# Patient Record
Sex: Male | Born: 1935 | Race: White | Hispanic: No | Marital: Married | Smoking: Never smoker
Health system: Southern US, Community
[De-identification: ages and names within clinical notes are randomized; demographics above are authoritative.]

## PROBLEM LIST (undated history)

## (undated) DIAGNOSIS — G4733 Obstructive sleep apnea (adult) (pediatric): Secondary | ICD-10-CM

## (undated) DIAGNOSIS — E785 Hyperlipidemia, unspecified: Secondary | ICD-10-CM

## (undated) DIAGNOSIS — I2510 Atherosclerotic heart disease of native coronary artery without angina pectoris: Secondary | ICD-10-CM

## (undated) DIAGNOSIS — I6529 Occlusion and stenosis of unspecified carotid artery: Secondary | ICD-10-CM

## (undated) DIAGNOSIS — I10 Essential (primary) hypertension: Secondary | ICD-10-CM

## (undated) DIAGNOSIS — I509 Heart failure, unspecified: Secondary | ICD-10-CM

## (undated) DIAGNOSIS — R972 Elevated prostate specific antigen [PSA]: Secondary | ICD-10-CM

## (undated) HISTORY — DX: Elevated prostate specific antigen (PSA): R97.20

## (undated) HISTORY — DX: Atherosclerotic heart disease of native coronary artery without angina pectoris: I25.10

## (undated) HISTORY — DX: Obstructive sleep apnea (adult) (pediatric): G47.33

## (undated) HISTORY — DX: Occlusion and stenosis of unspecified carotid artery: I65.29

## (undated) HISTORY — DX: Essential (primary) hypertension: I10

## (undated) HISTORY — DX: Hyperlipidemia, unspecified: E78.5

---

## 2009-03-20 ENCOUNTER — Emergency Department (HOSPITAL_COMMUNITY): Admission: EM | Admit: 2009-03-20 | Discharge: 2009-03-20 | Payer: Self-pay | Admitting: Emergency Medicine

## 2009-03-21 ENCOUNTER — Encounter (INDEPENDENT_AMBULATORY_CARE_PROVIDER_SITE_OTHER): Payer: Self-pay | Admitting: Internal Medicine

## 2009-03-21 ENCOUNTER — Ambulatory Visit: Payer: Self-pay | Admitting: Internal Medicine

## 2009-03-21 ENCOUNTER — Observation Stay (HOSPITAL_COMMUNITY): Admission: EM | Admit: 2009-03-21 | Discharge: 2009-03-21 | Payer: Self-pay | Admitting: Emergency Medicine

## 2009-03-22 ENCOUNTER — Ambulatory Visit: Payer: Self-pay | Admitting: Cardiovascular Disease

## 2009-03-22 ENCOUNTER — Inpatient Hospital Stay (HOSPITAL_COMMUNITY): Admission: AD | Admit: 2009-03-22 | Discharge: 2009-03-25 | Payer: Self-pay | Admitting: Cardiovascular Disease

## 2009-03-22 ENCOUNTER — Encounter: Payer: Self-pay | Admitting: Internal Medicine

## 2009-03-22 ENCOUNTER — Ambulatory Visit: Payer: Self-pay

## 2009-03-30 ENCOUNTER — Telehealth: Payer: Self-pay | Admitting: Internal Medicine

## 2009-04-05 ENCOUNTER — Ambulatory Visit: Payer: Self-pay | Admitting: Internal Medicine

## 2009-04-05 DIAGNOSIS — I2510 Atherosclerotic heart disease of native coronary artery without angina pectoris: Secondary | ICD-10-CM | POA: Insufficient documentation

## 2009-04-05 DIAGNOSIS — E785 Hyperlipidemia, unspecified: Secondary | ICD-10-CM

## 2009-04-05 DIAGNOSIS — I10 Essential (primary) hypertension: Secondary | ICD-10-CM

## 2009-05-01 ENCOUNTER — Inpatient Hospital Stay (HOSPITAL_COMMUNITY): Admission: EM | Admit: 2009-05-01 | Discharge: 2009-05-02 | Payer: Self-pay | Admitting: Emergency Medicine

## 2009-05-01 ENCOUNTER — Ambulatory Visit: Payer: Self-pay | Admitting: Cardiology

## 2009-05-04 ENCOUNTER — Telehealth: Payer: Self-pay | Admitting: Internal Medicine

## 2009-05-16 ENCOUNTER — Encounter: Payer: Self-pay | Admitting: Internal Medicine

## 2009-05-16 ENCOUNTER — Ambulatory Visit: Payer: Self-pay | Admitting: Cardiovascular Disease

## 2009-05-16 ENCOUNTER — Encounter: Payer: Self-pay | Admitting: Nurse Practitioner

## 2009-05-24 ENCOUNTER — Encounter: Payer: Self-pay | Admitting: Internal Medicine

## 2009-07-18 ENCOUNTER — Ambulatory Visit: Payer: Self-pay | Admitting: Internal Medicine

## 2009-07-18 DIAGNOSIS — R0683 Snoring: Secondary | ICD-10-CM | POA: Insufficient documentation

## 2009-12-27 ENCOUNTER — Ambulatory Visit: Payer: Self-pay | Admitting: Internal Medicine

## 2010-07-24 NOTE — Assessment & Plan Note (Signed)
Summary: f84m   Visit Type:  Follow-up Primary Provider:  Dr. Lerry Paterson  CC:  no complaints.  History of Present Illness: Chris Guzman is a 77 y/o  male with h/o HTN, HL and OSA. Also with h/o coronary artery disease s/p DES to LAD stent and PCI of RCA and diagonal in Sept 2010. In November 2010 had relook cath showing patent stent and small vessel dz, not favorable for pci.  He has been medically managed and recently started on Imdur 30mg  two times a day and referred to cardiac rehab..  Returns for f/u. Feeling very good. Going to maintenance program for CR on Tues and Thurs.  Satving very actice in garde. No further CP.  No HF symptoms. Compliant with Plavix. Bruises easily. Felt much better with cardiac rehab. Snores after a long days work but wife has not witnessed any apnea.    Current Medications (verified): 1)  Plavix 75 Mg Tabs (Clopidogrel Bisulfate) .... Take One Tablet By Mouth Daily 2)  Simvastatin 80 Mg Tabs (Simvastatin) .... Take One Tablet By Mouth Daily At Bedtime 3)  Amlodipine Besylate 10 Mg Tabs (Amlodipine Besylate) .... Take One Tablet By Mouth Daily 4)  Aspirin 81 Mg Tbec (Aspirin) .... Take One Tablet By Mouth Daily 5)  Benicar Hct 20-12.5 Mg Tabs (Olmesartan Medoxomil-Hctz) .... Take One-Half Tablet By Mouth Once Daily. 6)  Centrum Silver  Tabs (Multiple Vitamins-Minerals) .... Take One Tablet By Mouth Once Daily. 7)  Folic Acid 400 Mcg Tabs (Folic Acid) .Marland Kitchen.. 1 Tab Once Daily 8)  Pepcid 20 Mg Tabs (Famotidine) .Marland Kitchen.. 1 Tab Once Daily 9)  Vitamin B-12 250 Mcg Tabs (Cyanocobalamin) .Marland Kitchen.. 1 Tab Once Daily 10)  Vitamin D 1000 Unit Tabs (Cholecalciferol) .Marland Kitchen.. 1 Tab Once Daily 11)  Isosorbide Mononitrate Cr 30 Mg Xr24h-Tab (Isosorbide Mononitrate) .Marland Kitchen.. 1 Tab Am 1 Tab Pm 12)  Fish Oil   Oil (Fish Oil) .... 1200mg  Two Times A Day 13)  Vitamin B-6 50 Mg Tabs (Pyridoxine Hcl) .... Once Daily 14)  Niacin 250 Mg Tabs (Niacin) .... 2 Tabs At Bedtime  Allergies (verified): No Known  Drug Allergies  Past History:  Past Medical History: Last updated: 07/18/2009 CAD   --aborted inferior ST elevation on treadmill 9/10   --PCI of RCA, DES stent to LAD with PTCA of D2 9/10   --Relook cath 04/2009 - patent LAD stent with small branch vessel and dist. RCA dzs with L>R collaterals - not favorable for PCI - Imdur 30mg  two times a day started. Hyperlipidemia Probable OSA HTN Elevated PSA  Review of Systems       As per HPI and past medical history; otherwise all systems negative.   Vital Signs:  Patient profile:   77 year old male Height:      71 inches Weight:      192 pounds BMI:     26.88 Pulse rate:   51 / minute BP sitting:   124 / 70  (right arm) Cuff size:   regular  Vitals Entered By: Hardin Negus, RMA (December 27, 2009 11:26 AM)  Physical Exam  General:  Well appearing. no resp difficulty HEENT: normal Neck: supple. no JVD. Carotids 2+ bilat; no bruits. No lymphadenopathy or thryomegaly appreciated. Cor: PMI nondisplaced. Regular rate & rhythm. No rubs, gallops, murmur. Lungs: clear Abdomen: soft, nontender, nondistended. No hepatosplenomegaly. No bruits or masses. Good bowel sounds. Extremities: no cyanosis, clubbing, rash, edema Neuro: alert & orientedx3, cranial nerves grossly intact. moves all 4 extremities w/o  difficulty. affect pleasant    Impression & Recommendations:  Problem # 1:  CAD, NATIVE VESSEL (ICD-414.01) Stable. No evidence of ischemia. Continue current regimen.  Problem # 2:  HYPERTENSION, BENIGN (ICD-401.1) Blood pressure well controlled. Continue current regimen.  Problem # 3:  HYPERLIPIDEMIA-MIXED (ICD-272.4) On simva 80 and amlodipine 10. There is new FDA black box warning against this will change simva to Crestor 40.  Other Orders: EKG w/ Interpretation (93000)  Patient Instructions: 1)  Stop Simvastatin 2)  Start Crestor 40mg  daily 3)  Your physician wants you to follow-up in: 6 months.  You will receive a  reminder letter in the mail two months in advance. If you don't receive a letter, please call our office to schedule the follow-up appointment. Prescriptions: CRESTOR 40 MG TABS (ROSUVASTATIN CALCIUM) Take one tablet by mouth daily.  #30 x 6   Entered by:   Meredith Staggers, RN   Authorized by:   Dolores Patty, MD, [facility]   Signed by:   Meredith Staggers, RN on 12/27/2009   Method used:   Electronically to        [facility] [address] * (retail)       [address]       [place], [place]  [address]       Ph: [phone]       Fax: [phone]   RxID:   [id number]

## 2010-07-24 NOTE — Letter (Signed)
Summary: [facility]   Imported By: Kassie Mends 08/11/2009 08:04:26  _____________________________________________________________________  External Attachment:    Type:   Image     Comment:   External Document

## 2010-07-24 NOTE — Assessment & Plan Note (Signed)
Summary: 2 MONTH/DMILLER   Visit Type:  Follow-up Primary Provider:  Dr. Lerry Paterson  CC:  no complaints pt states he is doing good with the last med change. Pt saw urologic  clinic because his PSA was high..  History of Present Illness: Chris Guzman is a 77 y/o  male with h/o HTN, HL and OSA. Also with h/o coronary artery disease s/p DES to LAD stent and PCI of RCA and diagonal in Sept 2010. In November 2010 had relook cath showing patent stent and small vessel dz, not favorable for pci.  He has been medically managed and recently started on Imdur 30mg  two times a day and referred to cardiac rehab..  Returns for f/u. Feeling much better. No further CP. Staying active. No HF symptoms. Compliant with Plavix. Felt much better with cardiac rehab. Now no longer exercising regularly. Snoring heavily but wife has not witnessed any apnea.   Recently found to have elevated PSA and recommened to stop Plavix and ASA for 7 days.   Current Medications (verified): 1)  Plavix 75 Mg Tabs (Clopidogrel Bisulfate) .... Take One Tablet By Mouth Daily 2)  Simvastatin 40 Mg Tabs (Simvastatin) .... Take One Tablet By Mouth Daily At Bedtime 3)  Amlodipine Besylate 10 Mg Tabs (Amlodipine Besylate) .... Take One Tablet By Mouth Daily 4)  Aspirin Ec 325 Mg Tbec (Aspirin) .... Take One Tablet By Mouth Daily 5)  Benicar Hct 20-12.5 Mg Tabs (Olmesartan Medoxomil-Hctz) .... Take One-Half Tablet By Mouth Once Daily. 6)  Centrum Silver  Tabs (Multiple Vitamins-Minerals) .... Take One Tablet By Mouth Once Daily. 7)  Folic Acid 400 Mcg Tabs (Folic Acid) .Marland Kitchen.. 1 Tab Once Daily 8)  Pepcid 20 Mg Tabs (Famotidine) .Marland Kitchen.. 1 Tab Once Daily 9)  Vitamin B-12 250 Mcg Tabs (Cyanocobalamin) .Marland Kitchen.. 1 Tab Once Daily 10)  Vitamin D 1000 Unit Tabs (Cholecalciferol) .Marland Kitchen.. 1 Tab Once Daily 11)  Isosorbide Mononitrate Cr 30 Mg Xr24h-Tab (Isosorbide Mononitrate) .Marland Kitchen.. 1 Tab Am 1 Tab Pm  Allergies (verified): No Known Drug Allergies  Past  History:  Past Medical History: CAD   --aborted inferior ST elevation on treadmill 9/10   --PCI of RCA, DES stent to LAD with PTCA of D2 9/10   --Relook cath 04/2009 - patent LAD stent with small branch vessel and dist. RCA dzs with L>R collaterals - not favorable for PCI - Imdur 30mg  two times a day started. Hyperlipidemia Probable OSA HTN Elevated PSA  Review of Systems       As per HPI and past medical history; otherwise all systems negative.   Vital Signs:  Patient profile:   77 year old male Height:      71 inches Weight:      199 pounds BMI:     27.86 Pulse rate:   60 / minute BP sitting:   130 / 80 Cuff size:   large  Vitals Entered By: Burnett Kanaris, CNA (July 18, 2009 3:28 PM)  Physical Exam  General:  Gen: well appearing. no resp difficulty HEENT: normal Neck: supple. no JVD. Carotids 2+ bilat; no bruits. No lymphadenopathy or thryomegaly appreciated. Cor: PMI nondisplaced. Regular rate & rhythm. No rubs, gallops, murmur. Lungs: clear Abdomen: soft, nontender, nondistended. No hepatosplenomegaly. No bruits or masses. Good bowel sounds. Extremities: no cyanosis, clubbing, rash, edema Neuro: alert & orientedx3, cranial nerves grossly intact. moves all 4 extremities w/o difficulty. affect pleasant    Impression & Recommendations:  Problem # 1:  CAD, NATIVE VESSEL (ICD-414.01) Stable. No evidence  of ischemia. Continue current regimen.Have encouraged to participate in the cardiac rehab maintenance program. Given DES to LAD would not stop Plavix for biopsy. I called and spoke to the nure in his urologist's office and discussed this with her. They have experiece doing prostate biopsies on Plavix and will discuss it with him. OK to hold ASA, if needed.  Problem # 2:  HYPERTENSION, BENIGN (ICD-401.1) Blood pressure well controlled. Continue current regimen.  Problem # 3:  HYPERLIPIDEMIA-MIXED (ICD-272.4) Followed by PCP. Goal LDL < 70. Continue  statin.  Problem # 4:  SNORING (ICD-786.09) No witnessed apnea. If persists will need sleep study or overnight oximetry.  Patient Instructions: 1)  Follow up in 6 months

## 2010-09-26 LAB — CARDIAC PANEL(CRET KIN+CKTOT+MB+TROPI)
CK, MB: 2.5 ng/mL (ref 0.3–4.0)
Relative Index: 1.8 (ref 0.0–2.5)
Relative Index: INVALID (ref 0.0–2.5)
Relative Index: INVALID (ref 0.0–2.5)
Total CK: 137 U/L (ref 7–232)
Troponin I: 0.02 ng/mL (ref 0.00–0.06)
Troponin I: 0.03 ng/mL (ref 0.00–0.06)

## 2010-09-26 LAB — POCT CARDIAC MARKERS
Myoglobin, poc: 90 ng/mL (ref 12–200)
Troponin i, poc: <0.05 ng/mL (ref 0.00–0.09)

## 2010-09-26 LAB — CBC
HCT: 43.6 % (ref 39.0–52.0)
Hemoglobin: 13.6 g/dL (ref 13.0–17.0)
Hemoglobin: 15.1 g/dL (ref 13.0–17.0)
MCV: 97.9 fL (ref 78.0–100.0)
MCV: 98 fL (ref 78.0–100.0)
Platelets: 150 10*3/uL (ref 150–400)
RBC: 4.05 MIL/uL — ABNORMAL LOW (ref 4.22–5.81)
RDW: 13.4 % (ref 11.5–15.5)
WBC: 6.3 10*3/uL (ref 4.0–10.5)

## 2010-09-26 LAB — DIFFERENTIAL
Eosinophils Absolute: 0.1 10*3/uL (ref 0.0–0.7)
Eosinophils Relative: 2 % (ref 0–5)
Lymphocytes Relative: 19 % (ref 12–46)
Lymphs Abs: 1.2 10*3/uL (ref 0.7–4.0)
Monocytes Relative: 12 % (ref 3–12)

## 2010-09-26 LAB — APTT: aPTT: 35 seconds (ref 24–37)

## 2010-09-26 LAB — COMPREHENSIVE METABOLIC PANEL
ALT: 18 U/L (ref 0–53)
AST: 19 U/L (ref 0–37)
Albumin: 3.9 g/dL (ref 3.5–5.2)
CO2: 29 mEq/L (ref 19–32)
Calcium: 9.6 mg/dL (ref 8.4–10.5)
Creatinine, Ser: 1.01 mg/dL (ref 0.4–1.5)
GFR calc Af Amer: >60 mL/min (ref >60)
Sodium: 141 mEq/L (ref 135–145)

## 2010-09-26 LAB — BASIC METABOLIC PANEL
CO2: 29 mEq/L (ref 19–32)
Calcium: 9.2 mg/dL (ref 8.4–10.5)
Chloride: 105 mEq/L (ref 96–112)
Creatinine, Ser: 1.1 mg/dL (ref 0.4–1.5)
Creatinine, Ser: 1.24 mg/dL (ref 0.4–1.5)
GFR calc Af Amer: >60 mL/min (ref >60)
GFR calc Af Amer: >60 mL/min (ref >60)
GFR calc non Af Amer: >60 mL/min (ref >60)
Glucose, Bld: 87 mg/dL (ref 70–99)
Sodium: 140 mEq/L (ref 135–145)

## 2010-09-26 LAB — TROPONIN I: Troponin I: 0.02 ng/mL (ref 0.00–0.06)

## 2010-09-26 LAB — POCT I-STAT, CHEM 8
BUN: 13 mg/dL (ref 6–23)
Calcium, Ion: 1.19 mmol/L (ref 1.12–1.32)
Chloride: 101 mEq/L (ref 96–112)
Creatinine, Ser: 1 mg/dL (ref 0.4–1.5)
Sodium: 141 mEq/L (ref 135–145)
TCO2: 28 mmol/L (ref 0–100)

## 2010-09-27 ENCOUNTER — Other Ambulatory Visit: Payer: Self-pay | Admitting: Internal Medicine

## 2010-09-27 LAB — CBC
Hemoglobin: 14.2 g/dL (ref 13.0–17.0)
MCHC: 34.4 g/dL (ref 30.0–36.0)
MCHC: 34.4 g/dL (ref 30.0–36.0)
MCV: 97.2 fL (ref 78.0–100.0)
Platelets: 150 10*3/uL (ref 150–400)
RBC: 4.25 MIL/uL (ref 4.22–5.81)
RDW: 13.4 % (ref 11.5–15.5)
RDW: 13.6 % (ref 11.5–15.5)

## 2010-09-27 LAB — BASIC METABOLIC PANEL
CO2: 28 mEq/L (ref 19–32)
CO2: 29 mEq/L (ref 19–32)
Calcium: 8.8 mg/dL (ref 8.4–10.5)
Chloride: 103 mEq/L (ref 96–112)
Creatinine, Ser: 1.16 mg/dL (ref 0.4–1.5)
Creatinine, Ser: 1.26 mg/dL (ref 0.4–1.5)
GFR calc Af Amer: >60 mL/min (ref >60)
Glucose, Bld: 91 mg/dL (ref 70–99)
Glucose, Bld: 94 mg/dL (ref 70–99)
Sodium: 138 mEq/L (ref 135–145)

## 2010-09-28 LAB — PROTIME-INR
INR: 1 (ref 0.00–1.49)
Prothrombin Time: 13.2 seconds (ref 11.6–15.2)

## 2010-09-28 LAB — DIFFERENTIAL
Lymphocytes Relative: 21 % (ref 12–46)
Lymphs Abs: 1.5 10*3/uL (ref 0.7–4.0)
Monocytes Relative: 9 % (ref 3–12)
Neutro Abs: 5.1 10*3/uL (ref 1.7–7.7)
Neutrophils Relative %: 68 % (ref 43–77)

## 2010-09-28 LAB — CBC
HCT: 41.8 % (ref 39.0–52.0)
Hemoglobin: 13.9 g/dL (ref 13.0–17.0)
Hemoglobin: 14.2 g/dL (ref 13.0–17.0)
MCHC: 34.1 g/dL (ref 30.0–36.0)
MCHC: 34.2 g/dL (ref 30.0–36.0)
MCV: 96.8 fL (ref 78.0–100.0)
Platelets: 162 10*3/uL (ref 150–400)
Platelets: 164 10*3/uL (ref 150–400)
RBC: 4.23 MIL/uL (ref 4.22–5.81)
RDW: 13.2 % (ref 11.5–15.5)
RDW: 13.5 % (ref 11.5–15.5)
WBC: 7.1 10*3/uL (ref 4.0–10.5)

## 2010-09-28 LAB — TROPONIN I: Troponin I: 0.03 ng/mL (ref 0.00–0.06)

## 2010-09-28 LAB — CARDIAC PANEL(CRET KIN+CKTOT+MB+TROPI)
CK, MB: 2 ng/mL (ref 0.3–4.0)
Relative Index: INVALID (ref 0.0–2.5)
Total CK: 69 U/L (ref 7–232)
Troponin I: 0.05 ng/mL (ref 0.00–0.06)
Troponin I: 0.08 ng/mL — ABNORMAL HIGH (ref 0.00–0.06)

## 2010-09-28 LAB — BRAIN NATRIURETIC PEPTIDE: Pro B Natriuretic peptide (BNP): 68 pg/mL (ref 0.0–100.0)

## 2010-09-28 LAB — LIPID PANEL
HDL: 38 mg/dL — ABNORMAL LOW (ref >39)
LDL Cholesterol: 101 mg/dL — ABNORMAL HIGH (ref 0–99)
Triglycerides: 43 mg/dL (ref <150)
VLDL: 9 mg/dL (ref 0–40)

## 2010-09-28 LAB — URINALYSIS, ROUTINE W REFLEX MICROSCOPIC
Bilirubin Urine: NEGATIVE
Nitrite: NEGATIVE
Protein, ur: NEGATIVE mg/dL
Specific Gravity, Urine: 1.015 (ref 1.005–1.030)
Urobilinogen, UA: 1 mg/dL (ref 0.0–1.0)

## 2010-09-28 LAB — COMPREHENSIVE METABOLIC PANEL
Alkaline Phosphatase: 51 U/L (ref 39–117)
BUN: 14 mg/dL (ref 6–23)
BUN: 18 mg/dL (ref 6–23)
CO2: 27 mEq/L (ref 19–32)
CO2: 29 mEq/L (ref 19–32)
Calcium: 8.9 mg/dL (ref 8.4–10.5)
Chloride: 105 mEq/L (ref 96–112)
Creatinine, Ser: 1.1 mg/dL (ref 0.4–1.5)
Creatinine, Ser: 1.12 mg/dL (ref 0.4–1.5)
GFR calc non Af Amer: >60 mL/min (ref >60)
GFR calc non Af Amer: >60 mL/min (ref >60)
Glucose, Bld: 119 mg/dL — ABNORMAL HIGH (ref 70–99)
Glucose, Bld: 97 mg/dL (ref 70–99)
Potassium: 3.9 mEq/L (ref 3.5–5.1)
Sodium: 138 mEq/L (ref 135–145)
Total Bilirubin: 1.1 mg/dL (ref 0.3–1.2)
Total Protein: 6.6 g/dL (ref 6.0–8.3)

## 2010-09-28 LAB — URINE DRUGS OF ABUSE SCREEN W ALC, ROUTINE (REF LAB)
Barbiturate Quant, Ur: NEGATIVE
Benzodiazepines.: NEGATIVE
Cocaine Metabolites: NEGATIVE
Creatinine,U: 83.9 mg/dL
Ethyl Alcohol: <10 mg/dL (ref <10)
Methadone: NEGATIVE
Phencyclidine (PCP): NEGATIVE

## 2010-09-28 LAB — BASIC METABOLIC PANEL
BUN: 14 mg/dL (ref 6–23)
CO2: 31 mEq/L (ref 19–32)
Calcium: 8.9 mg/dL (ref 8.4–10.5)
Creatinine, Ser: 1.14 mg/dL (ref 0.4–1.5)
Glucose, Bld: 92 mg/dL (ref 70–99)
Sodium: 141 mEq/L (ref 135–145)

## 2010-09-28 LAB — CK TOTAL AND CKMB (NOT AT ARMC)
Relative Index: INVALID (ref 0.0–2.5)
Total CK: 72 U/L (ref 7–232)
Total CK: 82 U/L (ref 7–232)

## 2010-09-28 LAB — LIPASE, BLOOD: Lipase: 26 U/L (ref 11–59)

## 2010-09-28 LAB — HEMOGLOBIN A1C: Mean Plasma Glucose: 108 mg/dL

## 2010-12-27 ENCOUNTER — Encounter: Payer: Self-pay | Admitting: Internal Medicine

## 2011-02-06 ENCOUNTER — Encounter: Payer: Self-pay | Admitting: Internal Medicine

## 2011-02-06 ENCOUNTER — Ambulatory Visit (INDEPENDENT_AMBULATORY_CARE_PROVIDER_SITE_OTHER): Payer: Medicare Other | Admitting: Internal Medicine

## 2011-02-06 DIAGNOSIS — I2510 Atherosclerotic heart disease of native coronary artery without angina pectoris: Secondary | ICD-10-CM

## 2011-02-06 DIAGNOSIS — E785 Hyperlipidemia, unspecified: Secondary | ICD-10-CM

## 2011-02-06 DIAGNOSIS — R0609 Other forms of dyspnea: Secondary | ICD-10-CM

## 2011-02-06 DIAGNOSIS — I10 Essential (primary) hypertension: Secondary | ICD-10-CM

## 2011-02-06 MED ORDER — NITROGLYCERIN 0.4 MG SL SUBL: 0.4000 mg | SUBLINGUAL_TABLET | SUBLINGUAL | Status: DC | PRN

## 2011-02-06 NOTE — Patient Instructions (Signed)
Stop Plavix  Your physician recommends that you schedule a follow-up appointment in: 1 year

## 2011-02-06 NOTE — Assessment & Plan Note (Addendum)
Stable with no evidence of ischemia.  Continue current regimen except for Plavix.  Will discontinue plavix with increased bruising.

## 2011-02-06 NOTE — Assessment & Plan Note (Signed)
Mildly hypertensive while in office but BP readings from CR are at target.  Would continue current regimen.

## 2011-02-06 NOTE — Progress Notes (Signed)
HPI:  Chris Guzman is a 77 y/o  male with h/o HTN, HL and OSA. Also with h/o coronary artery disease s/p DES to LAD stent and PCI of RCA and diagonal in Sept 2010. In November 2010 had relook cath showing patent stent and small vessel dz, not favorable for pci.  He has been medically managed and recently started on Imdur 30mg  two times a day and referred to cardiac rehab and continues to go on Tues and Thurs.    He returns for scheduled f/u today.  He is doing well.  He continues to complete cardiac rehab twice a week as well as work in his garden daily.  He denies CP, SOB, orthopnea or PND.  He does not record his BP at home but at rehab he states SBP 100-112.  Continues to be compliant with all his meds.  He continues to bruise easily with plavix use.  His wifes says snoring has continues but sleeping well throughout the evening.     ROS: All systems negative except as listed in HPI, PMH and Problem List.  Past Medical History  Diagnosis Date  . CAD (coronary artery disease)     aborted inf STEMI on treadmill 9/10; PCI of RCA, DES to LAD  with PTCA  of D2 9/10; relook cath 11/10-patent LAD stent with small branch vessel and dis RCA disease  . Hyperlipidemia   . OSA (obstructive sleep apnea)     probable  . HTN (hypertension)   . Elevated PSA     Current Outpatient Prescriptions  Medication Sig Dispense Refill  . amLODipine (NORVASC) 10 MG tablet Take 10 mg by mouth daily.        Marland Kitchen aspirin 81 MG tablet Take 81 mg by mouth daily.        . Cholecalciferol (VITAMIN D) 1000 UNITS capsule Take 1,000 Units by mouth daily.        . famotidine (PEPCID) 20 MG tablet Take 20 mg by mouth daily as needed.       . fish oil-omega-3 fatty acids 1000 MG capsule Take by mouth 2 (two) times daily. 1200mg  two times a day       . folic acid (FOLVITE) 400 MCG tablet Take 400 mcg by mouth daily.        . isosorbide mononitrate (IMDUR) 30 MG CR tablet Take by mouth. 1 tab am 1 tab pm       . Multiple  Vitamins-Minerals (CENTRUM SILVER PO) Take 1 capsule by mouth daily.        Marland Kitchen olmesartan-hydrochlorothiazide (BENICAR HCT) 20-12.5 MG per tablet Take 0.5 tablets by mouth daily.        Marland Kitchen PLAVIX 75 MG tablet TAKE ONE TABLET BY MOUTH EVERY DAY  30 each  6  . pyridOXINE (VITAMIN B-6) 50 MG tablet Take 50 mg by mouth daily.        . rosuvastatin (CRESTOR) 40 MG tablet Take 40 mg by mouth daily.        . vitamin B-12 (CYANOCOBALAMIN) 250 MCG tablet Take 250 mcg by mouth daily.           PHYSICAL EXAM: Filed Vitals:   02/06/11 1131  BP: 148/72  Pulse: 55   General:  Well appearing. No resp difficulty HEENT: normal Neck: supple. JVP flat. Carotids 2+ bilaterally; no bruits. No lymphadenopathy or thryomegaly appreciated. Cor: PMI normal. Regular rate & rhythm. No rubs, gallops or murmurs. Lungs: clear Abdomen: soft, nontender, nondistended. No hepatosplenomegaly. No bruits or  masses. Good bowel sounds. Extremities: no cyanosis, clubbing, rash, edema Neuro: alert & orientedx3, cranial nerves grossly intact. Moves all 4 extremities w/o difficulty. Affect pleasant.    ECG: Sinus brady at 55 bpm.  No acute changes.     ASSESSMENT & PLAN:

## 2011-02-06 NOTE — Assessment & Plan Note (Signed)
Followed by PCP.  Continue current regimen as he is doing well on crestor.

## 2011-02-06 NOTE — Assessment & Plan Note (Addendum)
Snoring continues but with his activity level being vigorous would not pursue sleep study at this time.

## 2011-03-18 ENCOUNTER — Other Ambulatory Visit: Payer: Self-pay | Admitting: Internal Medicine

## 2011-07-29 ENCOUNTER — Other Ambulatory Visit (HOSPITAL_COMMUNITY): Payer: Self-pay | Admitting: Internal Medicine

## 2012-01-21 ENCOUNTER — Telehealth (HOSPITAL_COMMUNITY): Payer: Self-pay | Admitting: Internal Medicine

## 2012-01-21 NOTE — Telephone Encounter (Signed)
Left message for pt to call back to discuss why he feels he would need antibiotics prior to tooth extraction.  Nothing noted in chart to suggest a need for this.

## 2012-01-21 NOTE — Telephone Encounter (Signed)
Pt wife wants to know if pt will need meds due to he is having teeth extracted and needs to asap. She wants to know if he needs antibiotics. If so please call and leave message. Thanks

## 2012-01-21 NOTE — Telephone Encounter (Signed)
Will forward to [place] and Dr Gala Romney to be reviewed.

## 2012-02-13 ENCOUNTER — Encounter (HOSPITAL_COMMUNITY): Payer: Self-pay

## 2012-02-13 ENCOUNTER — Ambulatory Visit (HOSPITAL_COMMUNITY)
Admission: RE | Admit: 2012-02-13 | Discharge: 2012-02-13 | Disposition: A | Discharge status: Home / Self Care | Payer: Medicare Other | Source: Ambulatory Visit | Attending: Internal Medicine | Admitting: Internal Medicine

## 2012-02-13 VITALS — BP 120/64 | HR 62 | Ht 71.0 in | Wt 194.0 lb

## 2012-02-13 DIAGNOSIS — E785 Hyperlipidemia, unspecified: Secondary | ICD-10-CM

## 2012-02-13 DIAGNOSIS — R0609 Other forms of dyspnea: Secondary | ICD-10-CM | POA: Insufficient documentation

## 2012-02-13 DIAGNOSIS — R0989 Other specified symptoms and signs involving the circulatory and respiratory systems: Secondary | ICD-10-CM | POA: Insufficient documentation

## 2012-02-13 DIAGNOSIS — I2510 Atherosclerotic heart disease of native coronary artery without angina pectoris: Secondary | ICD-10-CM

## 2012-02-13 DIAGNOSIS — I10 Essential (primary) hypertension: Secondary | ICD-10-CM | POA: Insufficient documentation

## 2012-02-13 DIAGNOSIS — E782 Mixed hyperlipidemia: Secondary | ICD-10-CM | POA: Insufficient documentation

## 2012-02-13 NOTE — Assessment & Plan Note (Addendum)
Blood pressure well controlled. Continue current regimen.  

## 2012-02-13 NOTE — Assessment & Plan Note (Addendum)
Noted R>L. I suspect this may be due to radiation of AoV. Check carotoid dopplers. S2 very crisp. 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

## 2012-02-13 NOTE — Assessment & Plan Note (Addendum)
Followed by PCP. Discussed results of AIM-HIGH trial (no reduction in CV events with niacin when added to patients already with their LDL controlled on statin). If unable to tolerate can stop and increase fish oil to 3 grams twice a day to help control TGs, Also increase activity and watch how much ice cream he is eating.

## 2012-02-13 NOTE — Assessment & Plan Note (Addendum)
No evidence of ischemia. Continue current regimen. Follow up in 1 year.  

## 2012-02-13 NOTE — Assessment & Plan Note (Addendum)
Symptoms very concerning for OSA. Will refer to pulmonary for sleep study.

## 2012-02-13 NOTE — Patient Instructions (Addendum)
You have been referred to Pulmonary  Your physician has requested that you have a carotid duplex. This test is an ultrasound of the carotid arteries in your neck. It looks at blood flow through these arteries that supply the brain with blood. Allow one hour for this exam. There are no restrictions or special instructions.  We will contact you in 1 year to schedule your next appointment.

## 2012-02-16 NOTE — Progress Notes (Signed)
PCP: Dr. Jonelle Sports  Patient ID: Chris Guzman, male   DOB: 1933/11/12, 78 y.o.   MRN: [id number] HPI:  Chris Guzman is a 78 y/o  male with h/o HTN, HL and OSA. Also with h/o coronary artery disease s/p DES to LAD stent and PCI of RCA and diagonal in Sept 2010. In November 2010 had relook cath showing patent stent and small vessel dz, not favorable for pci.  He has been medically managed and referred to cardiac rehab and continues to go on Tues and Thurs.    He returns for follow up. Continues to actively garden and goes to cardiac rehab 2 times per week..Denies SOB/PND/Orthopnea/CP. Cholesterol followed by family doctor. Complains of fatigue during the day. Snores a lot. Compliant with medications.  Dr. Lelon Perla recently restarted Niacin due to high TGs. Tolerating OK.     ROS: All systems negative except as listed in HPI, PMH and Problem List.  Past Medical History  Diagnosis Date  . CAD (coronary artery disease)     aborted inf STEMI on treadmill 9/10; PCI of RCA, DES to LAD  with PTCA  of D2 9/10; relook cath 11/10-patent LAD stent with small branch vessel and dis RCA disease  . Hyperlipidemia   . OSA (obstructive sleep apnea)     probable  . HTN (hypertension)   . Elevated PSA     Current Outpatient Prescriptions  Medication Sig Dispense Refill  . amLODipine (NORVASC) 10 MG tablet Take 10 mg by mouth daily.        Marland Kitchen aspirin 81 MG tablet Take 81 mg by mouth daily.        . Cholecalciferol (VITAMIN D) 1000 UNITS capsule Take 1,000 Units by mouth daily.        . famotidine (PEPCID) 20 MG tablet Take 20 mg by mouth daily as needed.       . fish oil-omega-3 fatty acids 1000 MG capsule Take by mouth 2 (two) times daily. 1200mg  two times a day       . folic acid (FOLVITE) 400 MCG tablet Take 400 mcg by mouth daily.        . isosorbide mononitrate (IMDUR) 30 MG 24 hr tablet       . Multiple Vitamins-Minerals (CENTRUM SILVER PO) Take 1 capsule by mouth daily.        . niacin 250 MG tablet  Take 250 mg by mouth daily with breakfast.      . nitroGLYCERIN (NITROSTAT) 0.4 MG SL tablet Place 1 tablet (0.4 mg total) under the tongue every 5 (five) minutes as needed for chest pain.  25 tablet  3  . olmesartan-hydrochlorothiazide (BENICAR HCT) 20-12.5 MG per tablet Take 0.5 tablets by mouth daily.       Marland Kitchen pyridOXINE (VITAMIN B-6) 50 MG tablet Take 50 mg by mouth daily.        . rosuvastatin (CRESTOR) 40 MG tablet       . vitamin B-12 (CYANOCOBALAMIN) 250 MCG tablet Take 250 mcg by mouth daily.          Physical: Filed Vitals:   02/13/12 1101  BP: 120/64  Pulse: 62  Height: 5\' 11"  (1.803 m)  Weight: 194 lb (87.998 kg)    General:  Well appearing. No resp difficulty wife present HEENT: normal Neck: supple. JVP flat. Carotids 2+ bilaterally; Carotid bruits R>L. Marland Kitchen No lymphadenopathy or thryomegaly appreciated. Cor: PMI normal. Regular rate & rhythm. No rubs, gallops 2/6 SEM over RSB Lungs: clear Abdomen: soft, nontender, nondistended.  No hepatosplenomegaly. No bruits or masses. Good bowel sounds. Extremities: no cyanosis, clubbing, rash, edema Neuro: alert & orientedx3, cranial nerves grossly intact. Moves all 4 extremities w/o difficulty. Affect pleasant.    ECG: .SR 58  No ST-T wave abnormalities.    ASSESSMENT & PLAN:

## 2012-03-06 ENCOUNTER — Institutional Professional Consult (permissible substitution): Payer: PRIVATE HEALTH INSURANCE | Admitting: Pulmonary Disease

## 2012-03-23 ENCOUNTER — Other Ambulatory Visit: Payer: Self-pay | Admitting: Internal Medicine

## 2012-04-08 ENCOUNTER — Institutional Professional Consult (permissible substitution): Payer: PRIVATE HEALTH INSURANCE | Admitting: Pulmonary Disease

## 2012-04-21 ENCOUNTER — Other Ambulatory Visit (HOSPITAL_COMMUNITY): Payer: Self-pay | Admitting: Internal Medicine

## 2012-11-11 ENCOUNTER — Other Ambulatory Visit (HOSPITAL_COMMUNITY): Payer: Self-pay | Admitting: Internal Medicine

## 2013-03-29 ENCOUNTER — Ambulatory Visit (HOSPITAL_COMMUNITY)
Admission: RE | Admit: 2013-03-29 | Discharge: 2013-03-29 | Disposition: A | Discharge status: Home / Self Care | Payer: Medicare Other | Source: Ambulatory Visit | Attending: Cardiology | Admitting: Cardiology

## 2013-03-29 VITALS — BP 144/70 | HR 71 | Wt 194.2 lb

## 2013-03-29 DIAGNOSIS — I6523 Occlusion and stenosis of bilateral carotid arteries: Secondary | ICD-10-CM

## 2013-03-29 DIAGNOSIS — I6529 Occlusion and stenosis of unspecified carotid artery: Secondary | ICD-10-CM

## 2013-03-29 DIAGNOSIS — E785 Hyperlipidemia, unspecified: Secondary | ICD-10-CM | POA: Insufficient documentation

## 2013-03-29 DIAGNOSIS — I2510 Atherosclerotic heart disease of native coronary artery without angina pectoris: Secondary | ICD-10-CM | POA: Insufficient documentation

## 2013-03-29 DIAGNOSIS — I359 Nonrheumatic aortic valve disorder, unspecified: Secondary | ICD-10-CM

## 2013-03-29 DIAGNOSIS — G4733 Obstructive sleep apnea (adult) (pediatric): Secondary | ICD-10-CM | POA: Insufficient documentation

## 2013-03-29 DIAGNOSIS — I498 Other specified cardiac arrhythmias: Secondary | ICD-10-CM | POA: Insufficient documentation

## 2013-03-29 DIAGNOSIS — Z79899 Other long term (current) drug therapy: Secondary | ICD-10-CM | POA: Insufficient documentation

## 2013-03-29 DIAGNOSIS — R0989 Other specified symptoms and signs involving the circulatory and respiratory systems: Secondary | ICD-10-CM | POA: Insufficient documentation

## 2013-03-29 DIAGNOSIS — Z7982 Long term (current) use of aspirin: Secondary | ICD-10-CM | POA: Insufficient documentation

## 2013-03-29 DIAGNOSIS — I10 Essential (primary) hypertension: Secondary | ICD-10-CM | POA: Insufficient documentation

## 2013-03-29 DIAGNOSIS — I658 Occlusion and stenosis of other precerebral arteries: Secondary | ICD-10-CM

## 2013-03-29 NOTE — Patient Instructions (Addendum)
Doing great!!!  Call any issues.  Follow up 1 year with ECHO.  Do the following things EVERYDAY: 1) Weigh yourself in the morning before breakfast. Write it down and keep it in a log. 2) Take your medicines as prescribed 3) Eat low salt foods-Limit salt (sodium) to 2000 mg per day.  4) Stay as active as you can everyday 5) Limit all fluids for the day to less than 2 liters

## 2013-03-30 DIAGNOSIS — I6529 Occlusion and stenosis of unspecified carotid artery: Secondary | ICD-10-CM | POA: Insufficient documentation

## 2013-03-30 DIAGNOSIS — I359 Nonrheumatic aortic valve disorder, unspecified: Secondary | ICD-10-CM | POA: Insufficient documentation

## 2013-03-30 NOTE — Progress Notes (Signed)
Patient ID: Rakeem Colley, male   DOB: October 12, 1933, 79 y.o.   MRN: [id number] PCP: Dr. Jonelle Sports  HPI: Latron is a 79 y/o  male with h/o HTN, HL and OSA. Also with h/o coronary artery disease s/p DES to LAD stent and PCI of RCA and diagonal in Sept 2010. In November 2010 had relook cath showing patent stent and small vessel dz, not favorable for pci.    Follow up: Last visit was referred for sleep study, however he cancelled apt. Doing pretty well overall. Denies SOB, CP, orthopnea, or edema. Having issues with R knee and saw MD in [place], had injection. Still gardening and is very active. Taking medications as prescribed. Dr. Jonelle Sports checking lipids.  ROS: All systems negative except as listed in HPI, PMH and Problem List.  Past Medical History  Diagnosis Date  . CAD (coronary artery disease)     aborted inf STEMI on treadmill 9/10; PCI of RCA, DES to LAD  with PTCA  of D2 9/10; relook cath 11/10-patent LAD stent with small branch vessel and dis RCA disease  . Hyperlipidemia   . OSA (obstructive sleep apnea)     probable  . HTN (hypertension)   . Elevated PSA     Current Outpatient Prescriptions  Medication Sig Dispense Refill  . amLODipine (NORVASC) 10 MG tablet Take 10 mg by mouth daily.        Marland Kitchen aspirin 81 MG tablet Take 81 mg by mouth daily.        . Cholecalciferol (VITAMIN D) 1000 UNITS capsule Take 1,000 Units by mouth daily.        . CRESTOR 40 MG tablet TAKE ONE TABLET BY MOUTH DAILY  30 tablet  6  . famotidine (PEPCID) 20 MG tablet Take 20 mg by mouth daily as needed.       . fish oil-omega-3 fatty acids 1000 MG capsule Take 1 g by mouth 2 (two) times daily.       . folic acid (FOLVITE) 400 MCG tablet Take 400 mcg by mouth daily.        . isosorbide mononitrate (IMDUR) 30 MG 24 hr tablet TAKE ONE TABLET BY MOUTH IN THE MORNING AND ONE TABLET IN THE EVENING  180 tablet  3  . Multiple Vitamins-Minerals (CENTRUM SILVER PO) Take 1 capsule by mouth daily.        . niacin 250 MG  tablet Take 250 mg by mouth daily with breakfast.      . nitroGLYCERIN (NITROSTAT) 0.4 MG SL tablet Place 1 tablet (0.4 mg total) under the tongue every 5 (five) minutes as needed for chest pain.  25 tablet  3  . olmesartan-hydrochlorothiazide (BENICAR HCT) 20-12.5 MG per tablet Take 0.5 tablets by mouth daily.       Marland Kitchen pyridOXINE (VITAMIN B-6) 50 MG tablet Take 25 mg by mouth daily.       . vitamin B-12 (CYANOCOBALAMIN) 250 MCG tablet Take 250 mcg by mouth daily.         No current facility-administered medications for this encounter.   Filed Vitals:   03/29/13 1309  BP: 144/70  Pulse: 71  Weight: 194 lb 4 oz (88.111 kg)  SpO2: 98%   Physical Exam: General:  Well appearing. No resp difficulty wife present HEENT: normal Neck: supple. JVP flat. Carotids 2+ bilaterally; Carotid bruits R>L. Marland Kitchen No lymphadenopathy or thryomegaly appreciated. Cor: PMI normal. Regular rate & rhythm. No rubs, gallops 2/6 SEM over RSB Lungs: clear Abdomen: soft, nontender, nondistended.  No hepatosplenomegaly. No bruits or masses. Good bowel sounds. Extremities: no cyanosis, clubbing, rash, edema Neuro: alert & orientedx3, cranial nerves grossly intact. Moves all 4 extremities w/o difficulty. Affect pleasant.  EKG: Sinus Brady 57 bpm. No ST-T wave abnormalities.    ASSESSMENT & PLAN:  1) CAD  - Remains active. no s/s of ischemia - continue statin and ASA. Can cut back Imudr as needed.  2) HTN - Stable. Continue olmesartan-HCTZ  3) HLD - managed by PCP. Goal LDL < 70. Continue statin. Titrate as needed.   4) OSA - Not interested in being set up for a sleep study.   5) Carotid bruit - Asymptomatic. Followed regularly with u/s by his PCP. Continue statin.  6) AS murmur - mild. Last echo 2010. Will get f/u echo with visit next year  Reuel Boom Reisa Coppola,MD 11:07 PM

## 2013-03-31 NOTE — Addendum Note (Signed)
Encounter addended by: Simon Rhein, CCT on: 03/31/2013  9:04 AM<BR>     Documentation filed: Charges VN

## 2013-05-06 ENCOUNTER — Other Ambulatory Visit: Payer: Self-pay | Admitting: *Deleted

## 2013-05-06 MED ORDER — ROSUVASTATIN CALCIUM 40 MG PO TABS: ORAL_TABLET | ORAL | Status: DC

## 2013-11-19 ENCOUNTER — Other Ambulatory Visit (HOSPITAL_COMMUNITY): Payer: Self-pay | Admitting: Internal Medicine

## 2014-02-18 ENCOUNTER — Other Ambulatory Visit: Payer: Self-pay

## 2014-02-18 MED ORDER — ROSUVASTATIN CALCIUM 40 MG PO TABS: ORAL_TABLET | ORAL | Status: DC

## 2014-03-31 ENCOUNTER — Ambulatory Visit (HOSPITAL_COMMUNITY): Payer: PRIVATE HEALTH INSURANCE

## 2014-03-31 ENCOUNTER — Encounter (HOSPITAL_COMMUNITY): Payer: PRIVATE HEALTH INSURANCE

## 2014-04-08 ENCOUNTER — Other Ambulatory Visit (HOSPITAL_COMMUNITY): Payer: Self-pay | Admitting: Cardiology

## 2014-04-08 DIAGNOSIS — I25119 Atherosclerotic heart disease of native coronary artery with unspecified angina pectoris: Secondary | ICD-10-CM

## 2014-04-11 ENCOUNTER — Encounter (HOSPITAL_COMMUNITY): Payer: Self-pay

## 2014-04-11 ENCOUNTER — Ambulatory Visit (HOSPITAL_COMMUNITY)
Admission: RE | Admit: 2014-04-11 | Discharge: 2014-04-11 | Disposition: A | Discharge status: Home / Self Care | Payer: Medicare Other | Source: Ambulatory Visit | Attending: Internal Medicine | Admitting: Internal Medicine

## 2014-04-11 ENCOUNTER — Ambulatory Visit (HOSPITAL_BASED_OUTPATIENT_CLINIC_OR_DEPARTMENT_OTHER)
Admission: RE | Admit: 2014-04-11 | Discharge: 2014-04-11 | Disposition: A | Discharge status: Home / Self Care | Payer: Medicare Other | Source: Ambulatory Visit | Attending: Cardiology | Admitting: Cardiology

## 2014-04-11 VITALS — BP 140/66 | HR 63 | Wt 189.8 lb

## 2014-04-11 DIAGNOSIS — G4733 Obstructive sleep apnea (adult) (pediatric): Secondary | ICD-10-CM

## 2014-04-11 DIAGNOSIS — I2510 Atherosclerotic heart disease of native coronary artery without angina pectoris: Secondary | ICD-10-CM | POA: Insufficient documentation

## 2014-04-11 DIAGNOSIS — I25119 Atherosclerotic heart disease of native coronary artery with unspecified angina pectoris: Secondary | ICD-10-CM

## 2014-04-11 DIAGNOSIS — I6521 Occlusion and stenosis of right carotid artery: Secondary | ICD-10-CM

## 2014-04-11 DIAGNOSIS — I10 Essential (primary) hypertension: Secondary | ICD-10-CM | POA: Diagnosis not present

## 2014-04-11 DIAGNOSIS — I350 Nonrheumatic aortic (valve) stenosis: Secondary | ICD-10-CM | POA: Diagnosis not present

## 2014-04-11 DIAGNOSIS — E785 Hyperlipidemia, unspecified: Secondary | ICD-10-CM | POA: Diagnosis not present

## 2014-04-11 DIAGNOSIS — I517 Cardiomegaly: Secondary | ICD-10-CM

## 2014-04-11 DIAGNOSIS — I359 Nonrheumatic aortic valve disorder, unspecified: Secondary | ICD-10-CM

## 2014-04-11 NOTE — Progress Notes (Signed)
Patient ID: Chris DolphinRichard Guzman, male   DOB: 07/31/1933, 80 y.o.   MRN: [id number] PCP: Dr. Jonelle SportsPomposini  HPI: Chris BurdockRichard is a 80 y/o  male with h/o HTN, HL and OSA. Also with h/o coronary artery disease s/p DES to LAD and PCI RCA/PTCA diagonal in Sept 2010. In November 2010 had relook cath showing patent stent and small vessel dz, not favorable for PCI.  Carotid dopplers (3/13): Minimal disease.  Echo (10/15): EF 60-65%, mild dilation of aortic root and ascending aorta (4.2/4.0 cm), normal RV size and systolic function, aortic sclerosis without stenosis.   He has been doing well since last appointment.  No chest or epigastric pain.  No exertional dyspnea.  He keeps moderately active.  He snores and falls asleep in the afternoon if he is watching TV.  He does some hunting still.   ROS: All systems negative except as listed in HPI, PMH and Problem List.  Past Medical History  Diagnosis Date  . CAD (coronary artery disease)     aborted inf STEMI on treadmill 9/10; PCI of RCA, DES to LAD  with PTCA  of D2 9/10; relook cath 11/10-patent LAD stent with small branch vessel and dis RCA disease  . Hyperlipidemia   . OSA (obstructive sleep apnea)     probable  . HTN (hypertension)   . Elevated PSA     Current Outpatient Prescriptions  Medication Sig Dispense Refill  . amLODipine (NORVASC) 10 MG tablet Take 10 mg by mouth daily.        Marland Kitchen. aspirin 81 MG tablet Take 81 mg by mouth daily.        . Cholecalciferol (VITAMIN D) 1000 UNITS capsule Take 1,000 Units by mouth daily.        . famotidine (PEPCID) 20 MG tablet Take 20 mg by mouth daily as needed.       . fish oil-omega-3 fatty acids 1000 MG capsule Take 1 g by mouth 2 (two) times daily.       . folic acid (FOLVITE) 400 MCG tablet Take 800 mcg by mouth daily.       . isosorbide mononitrate (IMDUR) 30 MG 24 hr tablet TAKE ONE TABLET BY MOUTH IN THE MORNING AND ONE TABLET IN THE EVENING  180 tablet  3  . Multiple Vitamins-Minerals (CENTRUM SILVER PO) Take  1 capsule by mouth daily.        . niacin 250 MG tablet Take 250 mg by mouth daily with breakfast.      . nitroGLYCERIN (NITROSTAT) 0.4 MG SL tablet Place 1 tablet (0.4 mg total) under the tongue every 5 (five) minutes as needed for chest pain.  25 tablet  3  . olmesartan-hydrochlorothiazide (BENICAR HCT) 20-12.5 MG per tablet Take 0.5 tablets by mouth daily.       Marland Kitchen. pyridOXINE (VITAMIN B-6) 50 MG tablet Take 25 mg by mouth daily.       . rosuvastatin (CRESTOR) 40 MG tablet TAKE ONE TABLET BY MOUTH DAILY  30 tablet  3  . vitamin B-12 (CYANOCOBALAMIN) 250 MCG tablet Take 250 mcg by mouth daily.         No current facility-administered medications for this encounter.   Filed Vitals:   04/11/14 0954  BP: 140/66  Pulse: 63  Weight: 189 lb 12.8 oz (86.093 kg)  SpO2: 98%   Physical Exam: General:  Well appearing. No resp difficulty wife present HEENT: normal Neck: supple. JVP flat. Carotids 2+ bilaterally; Soft right carotid bruit. No lymphadenopathy or  thryomegaly appreciated. Cor: PMI normal. Regular rate & rhythm. No rubs, gallops 1/6 early SEM over RSB Lungs: clear Abdomen: soft, nontender, nondistended. No hepatosplenomegaly. No bruits or masses. Good bowel sounds. Extremities: no cyanosis, clubbing, rash, edema Neuro: alert & orientedx3, cranial nerves grossly intact. Moves all 4 extremities w/o difficulty. Affect pleasant.  ASSESSMENT & PLAN: 1) CAD: No ischemic symptoms.  Doing well overall.  Continue ASA 81, statin, Imdur, ARB.  EF is normal on today's echo with no regional wall motion abnormalities.  2) HTN: BP controlled on current regimen. 3) Hyperlipidemia: Goal LDL < 70.  Recently had lipids at PCP's office, will call for copy.  4) OSA: Suspect OSA.  [place] to [place] in [place].  He is amenable to this. 5) Carotid bruit: PCP follows with carotid ultrasounds.  Last we have is from 2013 with minimal disease.  6) Aortic area murmur: Aortic sclerosis without  significant stenosis on today's echo.   Marca AnconaDalton Micheal Murad 04/11/2014

## 2014-04-11 NOTE — Progress Notes (Signed)
Echocardiogram 2D Echocardiogram has been performed.  Dorothey BasemanReel, Haroldine Redler M 04/11/2014, 8:43 AM

## 2014-04-11 NOTE — Patient Instructions (Signed)
We will contact Dr Jonelle SportsPomposini for your lab work and to inquire about a sleep study  We will contact you in 1 year to schedule your next appointment.

## 2014-11-16 ENCOUNTER — Other Ambulatory Visit (HOSPITAL_COMMUNITY): Payer: Self-pay | Admitting: Internal Medicine

## 2014-11-18 ENCOUNTER — Other Ambulatory Visit (HOSPITAL_COMMUNITY): Payer: Self-pay | Admitting: *Deleted

## 2014-11-18 MED ORDER — ROSUVASTATIN CALCIUM 40 MG PO TABS: ORAL_TABLET | ORAL | Status: DC

## 2015-02-15 ENCOUNTER — Other Ambulatory Visit (HOSPITAL_COMMUNITY): Payer: Self-pay | Admitting: *Deleted

## 2015-02-15 MED ORDER — ISOSORBIDE MONONITRATE ER 30 MG PO TB24: ORAL_TABLET | ORAL | Status: DC

## 2015-02-15 NOTE — Telephone Encounter (Signed)
Pt's wife called for refill, rx sent in

## 2015-02-16 ENCOUNTER — Other Ambulatory Visit (HOSPITAL_COMMUNITY): Payer: Self-pay | Admitting: *Deleted

## 2015-02-16 MED ORDER — ROSUVASTATIN CALCIUM 40 MG PO TABS: ORAL_TABLET | ORAL | Status: DC

## 2015-04-14 ENCOUNTER — Ambulatory Visit (HOSPITAL_COMMUNITY)
Admission: RE | Admit: 2015-04-14 | Discharge: 2015-04-14 | Disposition: A | Discharge status: Home / Self Care | Payer: Medicare Other | Source: Ambulatory Visit | Attending: Internal Medicine | Admitting: Internal Medicine

## 2015-04-14 ENCOUNTER — Encounter (HOSPITAL_COMMUNITY): Payer: Self-pay | Admitting: Internal Medicine

## 2015-04-14 VITALS — BP 126/74 | HR 64 | Wt 191.4 lb

## 2015-04-14 DIAGNOSIS — I359 Nonrheumatic aortic valve disorder, unspecified: Secondary | ICD-10-CM

## 2015-04-14 DIAGNOSIS — E785 Hyperlipidemia, unspecified: Secondary | ICD-10-CM

## 2015-04-14 DIAGNOSIS — I358 Other nonrheumatic aortic valve disorders: Secondary | ICD-10-CM | POA: Diagnosis not present

## 2015-04-14 DIAGNOSIS — I2510 Atherosclerotic heart disease of native coronary artery without angina pectoris: Secondary | ICD-10-CM | POA: Diagnosis not present

## 2015-04-14 DIAGNOSIS — I10 Essential (primary) hypertension: Secondary | ICD-10-CM | POA: Diagnosis not present

## 2015-04-14 DIAGNOSIS — Z7982 Long term (current) use of aspirin: Secondary | ICD-10-CM | POA: Insufficient documentation

## 2015-04-14 DIAGNOSIS — R0683 Snoring: Secondary | ICD-10-CM | POA: Diagnosis not present

## 2015-04-14 DIAGNOSIS — I712 Thoracic aortic aneurysm, without rupture: Secondary | ICD-10-CM | POA: Insufficient documentation

## 2015-04-14 DIAGNOSIS — Z955 Presence of coronary angioplasty implant and graft: Secondary | ICD-10-CM | POA: Insufficient documentation

## 2015-04-14 DIAGNOSIS — Z79899 Other long term (current) drug therapy: Secondary | ICD-10-CM | POA: Diagnosis not present

## 2015-04-14 DIAGNOSIS — R0989 Other specified symptoms and signs involving the circulatory and respiratory systems: Secondary | ICD-10-CM | POA: Insufficient documentation

## 2015-04-14 NOTE — Progress Notes (Signed)
CARDIOLOGY CLINIC NOTE  Patient ID: Chris DolphinRichard Guzman, male   DOB: 1934-01-02, 81 y.o.   MRN: [id number] PCP: Dr. Jonelle SportsPomposini Cardiology: Bensimhon  HPI: Chris BurdockRichard is a 81 y/o  male with h/o HTN, HL and OSA. Also with h/o coronary artery disease s/p DES to LAD and PCI RCA/PTCA diagonal in Sept 2010. In November 2010 had relook cath showing patent stent and small vessel dz, not favorable for PCI.  Carotid dopplers (3/13): Minimal disease.  Echo (10/15): EF 60-65%, mild dilation of aortic root and ascending aorta (4.2/4.0 cm), normal RV size and systolic function, aortic sclerosis without stenosis.   He has been doing well since last appointment.  Working hard in the yard. No chest or epigastric pain.  No exertional dyspnea. Wife says he snores a lot and she has to shake him. Falls asleep quickly. No sleepiness when driving.    ROS: All systems negative except as listed in HPI, PMH and Problem List.  Past Medical History  Diagnosis Date  . CAD (coronary artery disease)     aborted inf STEMI on treadmill 9/10; PCI of RCA, DES to LAD  with PTCA  of D2 9/10; relook cath 11/10-patent LAD stent with small branch vessel and dis RCA disease  . Hyperlipidemia   . OSA (obstructive sleep apnea)     probable  . HTN (hypertension)   . Elevated PSA     Current Outpatient Prescriptions  Medication Sig Dispense Refill  . amLODipine (NORVASC) 10 MG tablet Take 10 mg by mouth daily.      Chris Guzman. aspirin 81 MG tablet Take 81 mg by mouth daily.      . Cholecalciferol (VITAMIN D) 1000 UNITS capsule Take 1,000 Units by mouth daily.      . famotidine (PEPCID) 20 MG tablet Take 20 mg by mouth daily as needed.     . fish oil-omega-3 fatty acids 1000 MG capsule Take 1 g by mouth 2 (two) times daily.     . folic acid (FOLVITE) 400 MCG tablet Take 800 mcg by mouth daily.     . isosorbide mononitrate (IMDUR) 30 MG 24 hr tablet TAKE ONE TABLET BY MOUTH IN THE MORNING AND ONE TABLET IN THE EVENING 180 tablet 2  . Multiple  Vitamins-Minerals (CENTRUM SILVER PO) Take 1 capsule by mouth daily.      . niacin 250 MG tablet Take 250 mg by mouth daily with breakfast.    . olmesartan-hydrochlorothiazide (BENICAR HCT) 20-12.5 MG per tablet Take 0.5 tablets by mouth daily.     Chris Guzman. pyridOXINE (VITAMIN B-6) 50 MG tablet Take 25 mg by mouth daily.     . rosuvastatin (CRESTOR) 40 MG tablet TAKE ONE TABLET BY MOUTH DAILY 30 tablet 3  . vitamin B-12 (CYANOCOBALAMIN) 250 MCG tablet Take 250 mcg by mouth daily.      . nitroGLYCERIN (NITROSTAT) 0.4 MG SL tablet Place 1 tablet (0.4 mg total) under the tongue every 5 (five) minutes as needed for chest pain. 25 tablet 3   No current facility-administered medications for this encounter.   Filed Vitals:   04/14/15 1045  BP: 126/74  Pulse: 64  Weight: 191 lb 6.4 oz (86.818 kg)  SpO2: 98%   Physical Exam: General:  Well appearing. No resp difficulty wife present HEENT: normal Neck: supple. JVP flat. Carotids 2+ bilaterally; Soft right carotid bruit. No lymphadenopathy or thryomegaly appreciated. Cor: PMI normal. Regular rate & rhythm. No rubs, gallops 1/6 early SEM over RSB Lungs: clear Abdomen: soft, nontender,  nondistended. No hepatosplenomegaly. No bruits or masses. Good bowel sounds. Extremities: no cyanosis, clubbing, rash, edema Neuro: alert & orientedx3, cranial nerves grossly intact. Moves all 4 extremities w/o difficulty. Affect pleasant.  ASSESSMENT & PLAN: 1) CAD: No ischemic symptoms.  Doing well overall.  Continue ASA 81, statin, ARB.  Can stop ImdurEF is normal. 2) HTN: BP controlled on current regimen. 3) Hyperlipidemia: Goal LDL < 70.  Followed with PCP. Recent AIM-HIGH trial with Niacin was negative for improvement in cardiac outcomes. So will stop.  4) Snoring: May have OSA but denies classic symptoms. Will leave it up to him if he wants sleep study.  5) Carotid bruit: PCP follows with carotid ultrasounds.  Last we have is from 2013 with minimal disease.  6)  Aortic area murmur: Aortic sclerosis without significant stenosis on echo. 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

## 2015-04-14 NOTE — Patient Instructions (Signed)
Stop Imdur  Stop Niacin  Follow up in 12 months with echo

## 2015-04-14 NOTE — Addendum Note (Signed)
Encounter addended by: Elaina PatteeKimberly D Lutterloh, RN on: 04/14/2015 11:27 AM<BR>     Documentation filed: Medications, Dx Association, Patient Instructions Section, Orders

## 2015-09-07 DIAGNOSIS — R739 Hyperglycemia, unspecified: Secondary | ICD-10-CM | POA: Insufficient documentation

## 2015-09-07 DIAGNOSIS — K635 Polyp of colon: Secondary | ICD-10-CM | POA: Insufficient documentation

## 2015-09-07 DIAGNOSIS — I259 Chronic ischemic heart disease, unspecified: Secondary | ICD-10-CM | POA: Insufficient documentation

## 2015-11-28 ENCOUNTER — Telehealth (HOSPITAL_COMMUNITY): Payer: Self-pay | Admitting: *Deleted

## 2015-11-28 DIAGNOSIS — R0789 Other chest pain: Secondary | ICD-10-CM

## 2015-11-28 NOTE — Telephone Encounter (Signed)
LATE ENTRY:  Pt called yesterday morning w/concerns of pain in his abd, denied CP, arm or jaw pain, denied sob.  He is a little concerned b/c when he had stents placed before his symptom was pain in his lower abd, he states below his belly button.  This pain he states is higher and a different type of discomfort.  This was discussed w/Dr Bensimhon on 6/5, he recommended pt could have LHC or myoview, he feels if pt is concerned same type of pain as before should do cath, if feels different and unsure if it's chest related can start w/myoview.  11/28/15:  Spoke w/pt he states he is having no discomfort today and feels pretty good.  He states the pain he was having has been off/on for past 8-10 days but he has not had it all today.  He feels the type of pain and location are different from when he had stents placed before and is agreeable to have a stress test.  GXT myoview sch for 6/7 at 10 am at [facility] office, pt aware and instructions reviewed with pt.

## 2015-11-28 NOTE — Telephone Encounter (Signed)
Patient given detailed instructions per Myocardial Perfusion Study Information Sheet for the test on 11/29/15 at 10:00. Patient notified to arrive 15 minutes early and that it is imperative to arrive on time for appointment to keep from having the test rescheduled.  If you need to cancel or reschedule your appointment, please call the office within 24 hours of your appointment. Failure to do so may result in a cancellation of your appointment, and a $50 no show fee. Patient verbalized understanding.Daneil DolinSharon S Brooks

## 2015-11-29 ENCOUNTER — Ambulatory Visit (HOSPITAL_COMMUNITY): Payer: Medicare Other | Attending: Cardiology

## 2015-11-29 ENCOUNTER — Encounter (HOSPITAL_COMMUNITY): Payer: PRIVATE HEALTH INSURANCE

## 2015-11-29 DIAGNOSIS — R9439 Abnormal result of other cardiovascular function study: Secondary | ICD-10-CM | POA: Insufficient documentation

## 2015-11-29 DIAGNOSIS — R109 Unspecified abdominal pain: Secondary | ICD-10-CM | POA: Diagnosis not present

## 2015-11-29 DIAGNOSIS — I10 Essential (primary) hypertension: Secondary | ICD-10-CM | POA: Insufficient documentation

## 2015-11-29 DIAGNOSIS — R0789 Other chest pain: Secondary | ICD-10-CM | POA: Insufficient documentation

## 2015-11-29 LAB — MYOCARDIAL PERFUSION IMAGING
CHL CUP RESTING HR STRESS: 59 {beats}/min
CHL RATE OF PERCEIVED EXERTION: 18
CSEPEW: 7 METS
Exercise duration (min): 6 min
Exercise duration (sec): 0 s
LHR: 0.34
LV dias vol: 118 mL (ref 62–150)
LV sys vol: 53 mL
MPHR: 141 {beats}/min
Peak HR: 126 {beats}/min
Percent HR: 90 %
SDS: 3
SRS: 2
SSS: 5
TID: 0.94

## 2015-11-29 MED ORDER — TECHNETIUM TC 99M TETROFOSMIN IV KIT
10.3000 | PACK | Freq: Once | INTRAVENOUS | Status: AC | PRN
  Administered 2015-11-29: 10 via INTRAVENOUS
  Filled 2015-11-29: qty 10

## 2015-11-29 MED ORDER — TECHNETIUM TC 99M TETROFOSMIN IV KIT
32.5000 | PACK | Freq: Once | INTRAVENOUS | Status: AC | PRN
  Administered 2015-11-29: 33 via INTRAVENOUS
  Filled 2015-11-29: qty 33

## 2016-01-29 ENCOUNTER — Other Ambulatory Visit (HOSPITAL_COMMUNITY): Payer: Self-pay | Admitting: Internal Medicine

## 2016-04-08 ENCOUNTER — Other Ambulatory Visit (HOSPITAL_COMMUNITY): Payer: Self-pay | Admitting: Internal Medicine

## 2016-05-08 ENCOUNTER — Ambulatory Visit (HOSPITAL_COMMUNITY)
Admission: RE | Admit: 2016-05-08 | Discharge: 2016-05-08 | Disposition: A | Discharge status: Home / Self Care | Payer: Medicare Other | Source: Ambulatory Visit | Attending: Internal Medicine | Admitting: Internal Medicine

## 2016-05-08 ENCOUNTER — Encounter (HOSPITAL_COMMUNITY): Payer: Self-pay | Admitting: Internal Medicine

## 2016-05-08 VITALS — BP 142/78 | HR 67 | Wt 192.8 lb

## 2016-05-08 DIAGNOSIS — I509 Heart failure, unspecified: Secondary | ICD-10-CM

## 2016-05-08 DIAGNOSIS — E785 Hyperlipidemia, unspecified: Secondary | ICD-10-CM | POA: Insufficient documentation

## 2016-05-08 DIAGNOSIS — I359 Nonrheumatic aortic valve disorder, unspecified: Secondary | ICD-10-CM

## 2016-05-08 DIAGNOSIS — I2510 Atherosclerotic heart disease of native coronary artery without angina pectoris: Secondary | ICD-10-CM | POA: Diagnosis not present

## 2016-05-08 DIAGNOSIS — R011 Cardiac murmur, unspecified: Secondary | ICD-10-CM | POA: Insufficient documentation

## 2016-05-08 DIAGNOSIS — I700 Atherosclerosis of aorta: Secondary | ICD-10-CM | POA: Insufficient documentation

## 2016-05-08 DIAGNOSIS — I119 Hypertensive heart disease without heart failure: Secondary | ICD-10-CM | POA: Diagnosis not present

## 2016-05-08 DIAGNOSIS — R0683 Snoring: Secondary | ICD-10-CM

## 2016-05-08 NOTE — Patient Instructions (Signed)
Echo has been ordered.  Reminder to schedule sleep study

## 2016-05-08 NOTE — Addendum Note (Signed)
Encounter addended by: Suezanne CheshirePamela S Lewis, RN on: 05/08/2016  9:39 AM<BR>    Actions taken: Visit diagnoses modified, Order list changed, Diagnosis association updated, Sign clinical note

## 2016-05-08 NOTE — Progress Notes (Signed)
CARDIOLOGY CLINIC NOTE  Patient ID: Chris DolphinRichard Idler, male   DOB: 1934-02-03, 82 y.o.   MRN: [id number] PCP: Dr. Jonelle SportsPomposini Cardiology: Bensimhon  HPI: Chris Guzman is a 82 y/o  male with h/o HTN, HL and OSA. Also with h/o coronary artery disease s/p DES to LAD and PCI RCA/PTCA diagonal in Sept 2010. In November 2010 had relook cath showing patent stent and small vessel dz, not favorable for PCI.  Carotid dopplers (3/13): Minimal disease.  Echo (10/15): EF 60-65%, mild dilation of aortic root and ascending aorta (4.2/4.0 cm), normal RV size and systolic function, aortic sclerosis without stenosis.   Wasn't feeling well this summer and had Myoview 6/17:  Nuclear stress EF: 55%.  There was no ST segment deviation noted during stress.  There is a small defect of mild severity present in the basal inferior and mid inferior location. The defect is partially-reversible. This is most consistent with variations in diaphragmatic attenuation.  This is a low risk study.  The left ventricular ejection fraction is normal (55-65%).     He has been doing well over the past year. Going to PT. Doing TM for 1 mile and weights 4 days per week. No exertional CP or undue DOE.  Wife says he snores a lot but no apnea.. Falls asleep quickly. No sleepiness when driving. +daytime sleepiness   ROS: All systems negative except as listed in HPI, PMH and Problem List.  Past Medical History:  Diagnosis Date  . CAD (coronary artery disease)    aborted inf STEMI on treadmill 9/10; PCI of RCA, DES to LAD  with PTCA  of D2 9/10; relook cath 11/10-patent LAD stent with small branch vessel and dis RCA disease  . Elevated PSA   . HTN (hypertension)   . Hyperlipidemia   . OSA (obstructive sleep apnea)    probable    Current Outpatient Prescriptions  Medication Sig Dispense Refill  . amLODipine (NORVASC) 10 MG tablet Take 10 mg by mouth daily.      Chris Guzman. aspirin 81 MG tablet Take 81 mg by mouth daily.      .  Cholecalciferol (VITAMIN D) 1000 UNITS capsule Take 1,000 Units by mouth daily.      . fish oil-omega-3 fatty acids 1000 MG capsule Take 1 g by mouth 2 (two) times daily.     . folic acid (FOLVITE) 400 MCG tablet Take 800 mcg by mouth daily.     . Multiple Vitamins-Minerals (CENTRUM SILVER PO) Take 1 capsule by mouth daily.      Chris Guzman. olmesartan-hydrochlorothiazide (BENICAR HCT) 20-12.5 MG per tablet Take 0.5 tablets by mouth daily.     Chris Guzman. pyridOXINE (VITAMIN B-6) 50 MG tablet Take 25 mg by mouth daily.     . rosuvastatin (CRESTOR) 40 MG tablet Take 1 tablet (40 mg total) by mouth daily. 30 tablet 3  . vitamin B-12 (CYANOCOBALAMIN) 250 MCG tablet Take 250 mcg by mouth daily.      . famotidine (PEPCID) 20 MG tablet Take 20 mg by mouth daily as needed.     . nitroGLYCERIN (NITROSTAT) 0.4 MG SL tablet Place 1 tablet (0.4 mg total) under the tongue every 5 (five) minutes as needed for chest pain. 25 tablet 3   No current facility-administered medications for this encounter.    Vitals:   05/08/16 0853  BP: (!) 142/78  Pulse: 67  SpO2: 97%  Weight: 192 lb 12.8 oz (87.5 kg)   Physical Exam: General:  Well appearing. No resp difficulty wife  present HEENT: normal Neck: supple. JVP flat. Carotids 2+ bilaterally; Soft right carotid bruit. No lymphadenopathy or thryomegaly appreciated. Cor: PMI normal. Regular rate & rhythm. No rubs, gallops 1/6 early SEM over RSB Lungs: clear Abdomen: soft, nontender, nondistended. No hepatosplenomegaly. No bruits or masses. Good bowel sounds. Extremities: no cyanosis, clubbing, rash, edema Neuro: alert & orientedx3, cranial nerves grossly intact. Moves all 4 extremities w/o difficulty. Affect pleasant.  ASSESSMENT & PLAN: 1) CAD: Remains active. No ischemic symptoms. Stress tet 6/17 was ok.  Doing well overall.  Continue ASA 81, statin, ARB.   2) HTN: BP controlled on current regimen. Insurance company is switching to generic olmesartan 3) Hyperlipidemia: On  crestor 40. Goal LDL < 70.  Followed with PCP.  4) Snoring: Likely OSA. Needs PSG.  5) Carotid bruit: PCP follows with carotid ultrasounds.  Last we have is from 2013 with minimal disease.  6) Aortic area murmur: Aortic sclerosis without significant stenosis on echo. 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

## 2016-05-21 ENCOUNTER — Ambulatory Visit (HOSPITAL_COMMUNITY)
Admission: RE | Admit: 2016-05-21 | Discharge: 2016-05-21 | Disposition: A | Discharge status: Home / Self Care | Payer: Medicare Other | Source: Ambulatory Visit | Attending: Internal Medicine | Admitting: Internal Medicine

## 2016-05-21 DIAGNOSIS — I509 Heart failure, unspecified: Secondary | ICD-10-CM | POA: Diagnosis present

## 2016-05-21 DIAGNOSIS — I358 Other nonrheumatic aortic valve disorders: Secondary | ICD-10-CM | POA: Diagnosis not present

## 2016-05-21 NOTE — Progress Notes (Signed)
Echocardiogram 2D Echocardiogram has been performed.  Chris Guzman 05/21/2016, 2:27 PM

## 2016-10-03 DIAGNOSIS — E559 Vitamin D deficiency, unspecified: Secondary | ICD-10-CM | POA: Insufficient documentation

## 2016-11-27 ENCOUNTER — Other Ambulatory Visit (HOSPITAL_COMMUNITY): Payer: Self-pay | Admitting: Cardiology

## 2016-11-27 MED ORDER — ROSUVASTATIN CALCIUM 40 MG PO TABS: 40.0000 mg | ORAL_TABLET | Freq: Every day | ORAL | 3 refills | Status: DC

## 2017-03-10 ENCOUNTER — Telehealth (HOSPITAL_COMMUNITY): Payer: Self-pay | Admitting: *Deleted

## 2017-03-10 NOTE — Telephone Encounter (Signed)
Patient's wife called reporting patient has been having swelling in lower legs/ankles for a couple months, with no increased shortness of breath.  Patient hadn't been seen since last November.  She asked if we could see him within the next week.  I have scheduled him on the PA/NP clinic, she is aware of appointment. No further questions.

## 2017-03-14 ENCOUNTER — Ambulatory Visit (HOSPITAL_COMMUNITY)
Admission: RE | Admit: 2017-03-14 | Discharge: 2017-03-14 | Disposition: A | Discharge status: Home / Self Care | Payer: Medicare Other | Source: Ambulatory Visit | Attending: Cardiology | Admitting: Cardiology

## 2017-03-14 VITALS — BP 110/68 | HR 69 | Wt 189.4 lb

## 2017-03-14 DIAGNOSIS — R0989 Other specified symptoms and signs involving the circulatory and respiratory systems: Secondary | ICD-10-CM | POA: Insufficient documentation

## 2017-03-14 DIAGNOSIS — E785 Hyperlipidemia, unspecified: Secondary | ICD-10-CM | POA: Diagnosis not present

## 2017-03-14 DIAGNOSIS — R0683 Snoring: Secondary | ICD-10-CM | POA: Diagnosis not present

## 2017-03-14 DIAGNOSIS — F329 Major depressive disorder, single episode, unspecified: Secondary | ICD-10-CM | POA: Insufficient documentation

## 2017-03-14 DIAGNOSIS — R011 Cardiac murmur, unspecified: Secondary | ICD-10-CM | POA: Diagnosis not present

## 2017-03-14 DIAGNOSIS — Z7982 Long term (current) use of aspirin: Secondary | ICD-10-CM | POA: Insufficient documentation

## 2017-03-14 DIAGNOSIS — Z79899 Other long term (current) drug therapy: Secondary | ICD-10-CM | POA: Insufficient documentation

## 2017-03-14 DIAGNOSIS — I10 Essential (primary) hypertension: Secondary | ICD-10-CM

## 2017-03-14 DIAGNOSIS — I2510 Atherosclerotic heart disease of native coronary artery without angina pectoris: Secondary | ICD-10-CM | POA: Insufficient documentation

## 2017-03-14 DIAGNOSIS — Q2543 Congenital aneurysm of aorta: Secondary | ICD-10-CM | POA: Diagnosis not present

## 2017-03-14 DIAGNOSIS — F32A Depression, unspecified: Secondary | ICD-10-CM

## 2017-03-14 DIAGNOSIS — I359 Nonrheumatic aortic valve disorder, unspecified: Secondary | ICD-10-CM

## 2017-03-14 DIAGNOSIS — Z955 Presence of coronary angioplasty implant and graft: Secondary | ICD-10-CM | POA: Diagnosis not present

## 2017-03-14 DIAGNOSIS — I509 Heart failure, unspecified: Secondary | ICD-10-CM

## 2017-03-14 LAB — CBC
HCT: 41.6 % (ref 39.0–52.0)
HEMOGLOBIN: 13.7 g/dL (ref 13.0–17.0)
MCH: 31.9 pg (ref 26.0–34.0)
MCHC: 32.9 g/dL (ref 30.0–36.0)
MCV: 96.7 fL (ref 78.0–100.0)
PLATELETS: 181 10*3/uL (ref 150–400)
RBC: 4.3 MIL/uL (ref 4.22–5.81)
RDW: 13.2 % (ref 11.5–15.5)
WBC: 7.1 10*3/uL (ref 4.0–10.5)

## 2017-03-14 LAB — LIPID PANEL
CHOL/HDL RATIO: 2.5 ratio
CHOLESTEROL: 135 mg/dL (ref 0–200)
HDL: 53 mg/dL (ref >40)
LDL Cholesterol: 67 mg/dL (ref 0–99)
Triglycerides: 74 mg/dL (ref <150)
VLDL: 15 mg/dL (ref 0–40)

## 2017-03-14 LAB — BASIC METABOLIC PANEL
ANION GAP: 6 (ref 5–15)
BUN: 18 mg/dL (ref 6–20)
CALCIUM: 9.3 mg/dL (ref 8.9–10.3)
CO2: 28 mmol/L (ref 22–32)
Chloride: 106 mmol/L (ref 101–111)
Creatinine, Ser: 1.3 mg/dL — ABNORMAL HIGH (ref 0.61–1.24)
GFR, EST AFRICAN AMERICAN: 58 mL/min — AB (ref >60)
GFR, EST NON AFRICAN AMERICAN: 50 mL/min — AB (ref >60)
Glucose, Bld: 126 mg/dL — ABNORMAL HIGH (ref 65–99)
POTASSIUM: 3.7 mmol/L (ref 3.5–5.1)
Sodium: 140 mmol/L (ref 135–145)

## 2017-03-14 LAB — TSH: TSH: 1.397 u[IU]/mL (ref 0.350–4.500)

## 2017-03-14 NOTE — Progress Notes (Signed)
CARDIOLOGY CLINIC NOTE  Patient ID: Chris Guzman, male   DOB: 1934-03-12, 83 y.o.   MRN: [id number] PCP: Dr. Jonelle Sports Cardiology: Bensimhon  HPI: Chris Guzman is a 83 y/o  male with h/o HTN, HL and OSA. Also with h/o coronary artery disease s/p DES to LAD and PCI RCA/PTCA diagonal in Sept 2010. In November 2010 had relook cath showing patent stent and small vessel dz, not favorable for PCI.  Carotid dopplers (3/13): Minimal disease.  Echo (10/15): EF 60-65%, mild dilation of aortic root and ascending aorta (4.2/4.0 cm), normal RV size and systolic function, aortic sclerosis without stenosis.   Wasn't feeling well this summer and had Myoview 6/17:  Nuclear stress EF: 55%.  There was no ST segment deviation noted during stress.  There is a small defect of mild severity present in the basal inferior and mid inferior location. The defect is partially-reversible. This is most consistent with variations in diaphragmatic attenuation.  This is a low risk study.  The left ventricular ejection fraction is normal (55-65%).    Returns today for HF follow up. Feeling ok overall, his wife says that he does not like to walk much. She says that he has no motivation to do anything, likes to stay around the house which is not his usual. She notes that he gets nervous easily. He gets SOB with stairs. Not SOB with walking around stores. Denies orthopnea, chest pain, PND. Taking all medications, drinking less than 2L a day. Eating some high salt foods. He has been referred for a sleep study in the past, but refuses sleep study. Denies chest pain, orthopnea, PND.   ROS: All systems negative except as listed in HPI, PMH and Problem List.  Past Medical History:  Diagnosis Date  . CAD (coronary artery disease)    aborted inf STEMI on treadmill 9/10; PCI of RCA, DES to LAD  with PTCA  of D2 9/10; relook cath 11/10-patent LAD stent with small branch vessel and dis RCA disease  . Elevated PSA   . HTN  (hypertension)   . Hyperlipidemia   . OSA (obstructive sleep apnea)    probable    Current Outpatient Prescriptions  Medication Sig Dispense Refill  . amLODipine (NORVASC) 10 MG tablet Take 10 mg by mouth daily.      Marland Kitchen aspirin 81 MG tablet Take 81 mg by mouth daily.      . Cholecalciferol (VITAMIN D) 1000 UNITS capsule Take 1,000 Units by mouth daily.      . fish oil-omega-3 fatty acids 1000 MG capsule Take 1 g by mouth 2 (two) times daily.     . folic acid (FOLVITE) 400 MCG tablet Take 800 mcg by mouth daily.     . irbesartan-hydrochlorothiazide (AVALIDE) 150-12.5 MG tablet Take 0.5 tablets by mouth daily.    . Multiple Vitamins-Minerals (CENTRUM SILVER PO) Take 1 capsule by mouth daily.      Marland Kitchen pyridOXINE (VITAMIN B-6) 50 MG tablet Take 25 mg by mouth daily.     . rosuvastatin (CRESTOR) 40 MG tablet Take 20 mg by mouth daily.    . vitamin B-12 (CYANOCOBALAMIN) 250 MCG tablet Take 250 mcg by mouth daily.      . famotidine (PEPCID) 20 MG tablet Take 20 mg by mouth daily as needed for heartburn.     . nitroGLYCERIN (NITROSTAT) 0.4 MG SL tablet Place 1 tablet (0.4 mg total) under the tongue every 5 (five) minutes as needed for chest pain. (Patient not taking: Reported on 03/14/2017)  25 tablet 3   No current facility-administered medications for this encounter.    Vitals:   03/14/17 1354  BP: 110/68  Pulse: 69  SpO2: 97%  Weight: 189 lb 6.4 oz (85.9 kg)   Physical Exam: General: Elderly male. No resp difficulty. HEENT: Normal Neck: Supple. JVP 5-6. Carotids 2+ bilat; no bruits. No thyromegaly or nodule noted. Cor: PMI nondisplaced. RRR,2/6 AS murmur Lungs: CTAB, normal effort. Abdomen: Soft, non-tender, non-distended, no HSM. No bruits or masses. +BS  Extremities: No cyanosis, clubbing, rash, R and LLE no edema.  Neuro: Alert & orientedx3, cranial nerves grossly intact. moves all 4 extremities w/o difficulty. Affect pleasant    ASSESSMENT & PLAN: 1) CAD: s/p DES to LAD in  2010, also with PTCA to RCA and diagonal in Sept. 2010.  - Denies chest pain, notes that his anginal equivalent is stomach pain. He denies any stomach pain.  - Nuclear study in 11/2015 nonischemic.  - Continue ASA, Statin.  - Check BMET, Lipids today.   2) HTN: Well controlled on current regimen - BMET today.   3) Hyperlipidemia:  - Check lipids today. Continue Crestor.   4) Snoring:  - Likely OSA, he refuses sleep study.    5) Carotid bruit:  - PCP follows carotid ultrasounds.   6) Aortic area murmur:  - Repeat Echo in 3 months.   7) Aortic root aneurysm: Mild by last Echo  8) Depression - He feels depressed about not being able to do the things he used to do. I encouraged him to talk to his PCP about this. He felt good when he was working out at [employer]. He wants to restart. He has a silver Field seismologist.   Follow up with Dr. Gala Romney in 3 months.   Little Ishikawa , NP-C   03/14/2017

## 2017-03-14 NOTE — Progress Notes (Signed)
Advanced Heart Failure Medication Review by a Pharmacist  Does the patient  feel that his/her medications are working for him/her?  yes  Has the patient been experiencing any side effects to the medications prescribed?  no  Does the patient measure his/her own blood pressure or blood glucose at home?  no   Does the patient have any problems obtaining medications due to transportation or finances?   no  Understanding of regimen: good Understanding of indications: good Potential of compliance: good Patient understands to avoid NSAIDs. Patient understands to avoid decongestants.  Issues to address at subsequent visits: None   Pharmacist comments: Mr. Morocho is a pleasant 83 yo M presenting with his wife and without a medication list but with good recall of his regimen. He reports good compliance with his regimen and did not have any specific medication-related questions or concerns for me at this time.   Tyler Deis. Bonnye Fava, PharmD, BCPS, CPP Clinical Pharmacist Pager: [phone] Phone: [phone] 03/14/2017 1:57 PM      Time with patient: 10 minutes Preparation and documentation time: 2 minutes Total time: 12 minutes

## 2017-03-14 NOTE — Patient Instructions (Signed)
Labs today We will only contact you if something comes back abnormal or we need to make some changes. Otherwise no news is good news!   Your physician recommends that you schedule a follow-up appointment in: 3 months with Dr Bensimhon  

## 2017-06-09 ENCOUNTER — Ambulatory Visit (HOSPITAL_COMMUNITY)
Admission: RE | Admit: 2017-06-09 | Discharge: 2017-06-09 | Disposition: A | Discharge status: Home / Self Care | Payer: Medicare Other | Source: Ambulatory Visit | Attending: Internal Medicine | Admitting: Internal Medicine

## 2017-06-09 ENCOUNTER — Telehealth (HOSPITAL_COMMUNITY): Payer: Self-pay | Admitting: Vascular Surgery

## 2017-06-09 VITALS — BP 142/88 | HR 67 | Wt 189.8 lb

## 2017-06-09 DIAGNOSIS — Z955 Presence of coronary angioplasty implant and graft: Secondary | ICD-10-CM | POA: Diagnosis not present

## 2017-06-09 DIAGNOSIS — I252 Old myocardial infarction: Secondary | ICD-10-CM | POA: Insufficient documentation

## 2017-06-09 DIAGNOSIS — R0683 Snoring: Secondary | ICD-10-CM | POA: Diagnosis not present

## 2017-06-09 DIAGNOSIS — I7121 Aneurysm of the ascending aorta, without rupture: Secondary | ICD-10-CM

## 2017-06-09 DIAGNOSIS — R0989 Other specified symptoms and signs involving the circulatory and respiratory systems: Secondary | ICD-10-CM | POA: Insufficient documentation

## 2017-06-09 DIAGNOSIS — I712 Thoracic aortic aneurysm, without rupture, unspecified: Secondary | ICD-10-CM

## 2017-06-09 DIAGNOSIS — E785 Hyperlipidemia, unspecified: Secondary | ICD-10-CM | POA: Diagnosis not present

## 2017-06-09 DIAGNOSIS — R011 Cardiac murmur, unspecified: Secondary | ICD-10-CM | POA: Diagnosis not present

## 2017-06-09 DIAGNOSIS — I719 Aortic aneurysm of unspecified site, without rupture: Secondary | ICD-10-CM

## 2017-06-09 DIAGNOSIS — I2510 Atherosclerotic heart disease of native coronary artery without angina pectoris: Secondary | ICD-10-CM | POA: Insufficient documentation

## 2017-06-09 DIAGNOSIS — Z79899 Other long term (current) drug therapy: Secondary | ICD-10-CM | POA: Insufficient documentation

## 2017-06-09 DIAGNOSIS — Z7982 Long term (current) use of aspirin: Secondary | ICD-10-CM | POA: Diagnosis not present

## 2017-06-09 DIAGNOSIS — I10 Essential (primary) hypertension: Secondary | ICD-10-CM | POA: Insufficient documentation

## 2017-06-09 DIAGNOSIS — Q2543 Congenital aneurysm of aorta: Secondary | ICD-10-CM | POA: Insufficient documentation

## 2017-06-09 LAB — BASIC METABOLIC PANEL
Anion gap: 6 (ref 5–15)
BUN: 14 mg/dL (ref 6–20)
CHLORIDE: 105 mmol/L (ref 101–111)
CO2: 29 mmol/L (ref 22–32)
Calcium: 9.3 mg/dL (ref 8.9–10.3)
Creatinine, Ser: 1.24 mg/dL (ref 0.61–1.24)
GFR calc Af Amer: >60 mL/min (ref >60)
GFR calc non Af Amer: 53 mL/min — ABNORMAL LOW (ref >60)
GLUCOSE: 110 mg/dL — AB (ref 65–99)
POTASSIUM: 4 mmol/L (ref 3.5–5.1)
Sodium: 140 mmol/L (ref 135–145)

## 2017-06-09 MED ORDER — LOSARTAN POTASSIUM 50 MG PO TABS: 50.0000 mg | ORAL_TABLET | Freq: Every day | ORAL | 3 refills | Status: DC

## 2017-06-09 NOTE — Addendum Note (Signed)
Encounter addended by: Noralee SpaceSchub, Nassir Neidert M, RN on: 06/09/2017 10:59 AM  Actions taken: Visit diagnoses modified, Order list changed, Diagnosis association updated, Sign clinical note

## 2017-06-09 NOTE — Addendum Note (Signed)
Encounter addended by: Georgina PeerFarver, Modean Mccullum S, RN on: 06/09/2017 11:30 AM  Actions taken: Diagnosis association updated, Pharmacy for encounter modified, Order list changed

## 2017-06-09 NOTE — Progress Notes (Signed)
CARDIOLOGY CLINIC NOTE  Patient ID: Chris DolphinRichard Blinn, male   DOB: October 19, 1933, 83 y.o.   MRN: [id number] PCP: Dr. Jonelle SportsPomposini Cardiology: Kayla Deshaies  HPI: Chris BurdockRichard is a 83 y/o  male with h/o HTN, HL and OSA. Also with h/o coronary artery disease s/p DES to LAD and PCI RCA/PTCA diagonal in Sept 2010. In November 2010 had relook cath showing patent stent and small vessel dz, not favorable for PCI.  Carotid dopplers (3/13): Minimal disease.  Echo (10/15): EF 60-65%, mild dilation of aortic root and ascending aorta (4.2/4.0 cm), normal RV size and systolic function, aortic sclerosis without stenosis.   Wasn't feeling well this summer and had Myoview 6/17:  Nuclear stress EF: 55%.  There was no ST segment deviation noted during stress.  There is a small defect of mild severity present in the basal inferior and mid inferior location. The defect is partially-reversible. This is most consistent with variations in diaphragmatic attenuation.  This is a low risk study.  The left ventricular ejection fraction is normal (55-65%).   Echo 11/17: EF 65-70%. Grade I DD. Aortic sclerosis without stenosis. AoRoot 4.4cm   Returns today for HF follow up.  Says he is doing OK. Benicar was switched to irbesartan/HCTZ due to insurance. Developed lightheadedness. So switched to losartan 25. Now feels better. No longer dizzy. Not exercising much. Denies CP or SOB. No edema. Struggles with memory at times.    ROS: All systems negative except as listed in HPI, PMH and Problem List.  Past Medical History:  Diagnosis Date  . CAD (coronary artery disease)    aborted inf STEMI on treadmill 9/10; PCI of RCA, DES to LAD  with PTCA  of D2 9/10; relook cath 11/10-patent LAD stent with small branch vessel and dis RCA disease  . Elevated PSA   . HTN (hypertension)   . Hyperlipidemia   . OSA (obstructive sleep apnea)    probable    Current Outpatient Medications  Medication Sig Dispense Refill  . amLODipine  (NORVASC) 10 MG tablet Take 10 mg by mouth daily.      Marland Kitchen. aspirin 81 MG tablet Take 81 mg by mouth daily.      . Cholecalciferol (VITAMIN D) 1000 UNITS capsule Take 1,000 Units by mouth daily.      . famotidine (PEPCID) 20 MG tablet Take 20 mg by mouth daily as needed for heartburn.     . fish oil-omega-3 fatty acids 1000 MG capsule Take 1 g by mouth 2 (two) times daily.     . folic acid (FOLVITE) 400 MCG tablet Take 800 mcg by mouth daily.     Marland Kitchen. losartan (COZAAR) 50 MG tablet Take 25 mg by mouth daily. Takes 1/2 tablet Once Daily    . Multiple Vitamins-Minerals (CENTRUM SILVER PO) Take 1 capsule by mouth daily.      . nitroGLYCERIN (NITROSTAT) 0.4 MG SL tablet Place 1 tablet (0.4 mg total) under the tongue every 5 (five) minutes as needed for chest pain. 25 tablet 3  . pyridOXINE (VITAMIN B-6) 50 MG tablet Take 25 mg by mouth daily.     . rosuvastatin (CRESTOR) 40 MG tablet Take 20 mg by mouth daily.    . vitamin B-12 (CYANOCOBALAMIN) 250 MCG tablet Take 250 mcg by mouth daily.       No current facility-administered medications for this encounter.    Vitals:   06/09/17 1038  BP: (!) 142/88  Pulse: 67  Weight: 189 lb 12.8 oz (86.1 kg)  Physical Exam: General:  Well appearing. No resp difficulty HEENT: normal Neck: supple. no JVD. Carotids 2+ bilat; no bruits. No lymphadenopathy or thryomegaly appreciated. Cor: PMI nondisplaced. Regular rate & rhythm. No rubs, gallops. Soft SEM at RUSB s2 ok  Lungs: clear Abdomen: soft, nontender, nondistended. No hepatosplenomegaly. No bruits or masses. Good bowel sounds. Extremities: no cyanosis, clubbing, rash, trace edema Neuro: alert & orientedx3, cranial nerves grossly intact. moves all 4 extremities w/o difficulty. Affect pleasant   ASSESSMENT & PLAN: 1) CAD: s/p DES to LAD in 2010, also with PTCA to RCA and diagonal in Sept. 2010.  - No s/s ischemia. He  notes that his anginal equivalent is stomach pain. He denies any stomach pain.  -  Nuclear study in 11/2015 nonischemic.  - Continue ASA, Statin.  - LDL 67 in 9/18 - Encouraged exercise  2) HTN:  - BP elevated slightly. Will increase losartan to 50 daily   3) Hyperlipidemia:  - Recent lipids reviewed with him. LDL at goal  4) Snoring:  - Likely OSA, he refused sleep study.    5) Carotid bruit:  - PCP follows carotid ultrasounds.   6) Aortic area murmur:  - Echo reviewed personally. AoV sclerosis without stenosis.   7) Aortic root aneurysm:  - slight increase by echo . Will check CT  Chris Guzman , MD   06/09/2017

## 2017-06-09 NOTE — Telephone Encounter (Signed)
Left pt message giving chest CT appt 06/26/17 @ 3:30 PM, ASKED PT TO CALL BACK TO CONFIRM APPT

## 2017-06-09 NOTE — Patient Instructions (Signed)
Increase Losartan to 50 mg daily  Labs today  Non-Cardiac CT scanning, (CAT scanning), is a noninvasive, special x-ray that produces cross-sectional images of the body using x-rays and a computer. CT scans help physicians diagnose and treat medical conditions. For some CT exams, a contrast material is used to enhance visibility in the area of the body being studied. CT scans provide greater clarity and reveal more details than regular x-ray exams.  Your physician discussed the importance of regular exercise and recommended that you start or continue a regular exercise program for good health.  We will contact you in 9 months to schedule your next appointment.

## 2017-06-26 ENCOUNTER — Ambulatory Visit (HOSPITAL_COMMUNITY)
Admission: RE | Admit: 2017-06-26 | Discharge: 2017-06-26 | Disposition: A | Discharge status: Home / Self Care | Payer: Medicare Other | Source: Ambulatory Visit | Attending: Internal Medicine | Admitting: Internal Medicine

## 2017-06-26 DIAGNOSIS — K769 Liver disease, unspecified: Secondary | ICD-10-CM | POA: Insufficient documentation

## 2017-06-26 DIAGNOSIS — R918 Other nonspecific abnormal finding of lung field: Secondary | ICD-10-CM | POA: Insufficient documentation

## 2017-06-26 DIAGNOSIS — K449 Diaphragmatic hernia without obstruction or gangrene: Secondary | ICD-10-CM | POA: Diagnosis not present

## 2017-06-26 DIAGNOSIS — I712 Thoracic aortic aneurysm, without rupture, unspecified: Secondary | ICD-10-CM

## 2017-06-26 DIAGNOSIS — I700 Atherosclerosis of aorta: Secondary | ICD-10-CM | POA: Diagnosis not present

## 2017-06-26 DIAGNOSIS — N289 Disorder of kidney and ureter, unspecified: Secondary | ICD-10-CM | POA: Insufficient documentation

## 2017-06-26 DIAGNOSIS — I7789 Other specified disorders of arteries and arterioles: Secondary | ICD-10-CM | POA: Insufficient documentation

## 2017-06-26 DIAGNOSIS — I7121 Aneurysm of the ascending aorta, without rupture: Secondary | ICD-10-CM

## 2017-06-26 DIAGNOSIS — I719 Aortic aneurysm of unspecified site, without rupture: Secondary | ICD-10-CM

## 2017-06-26 DIAGNOSIS — I2510 Atherosclerotic heart disease of native coronary artery without angina pectoris: Secondary | ICD-10-CM | POA: Insufficient documentation

## 2017-06-26 MED ORDER — IOPAMIDOL (ISOVUE-300) INJECTION 61%
INTRAVENOUS | Status: AC
  Filled 2017-06-26: qty 75

## 2017-10-28 DIAGNOSIS — I2721 Secondary pulmonary arterial hypertension: Secondary | ICD-10-CM | POA: Insufficient documentation

## 2017-12-06 ENCOUNTER — Other Ambulatory Visit (HOSPITAL_COMMUNITY): Payer: Self-pay | Admitting: Internal Medicine

## 2017-12-26 ENCOUNTER — Other Ambulatory Visit (HOSPITAL_COMMUNITY): Payer: Self-pay | Admitting: *Deleted

## 2017-12-26 DIAGNOSIS — I7121 Aneurysm of the ascending aorta, without rupture: Secondary | ICD-10-CM

## 2017-12-26 DIAGNOSIS — I359 Nonrheumatic aortic valve disorder, unspecified: Secondary | ICD-10-CM

## 2017-12-26 DIAGNOSIS — I719 Aortic aneurysm of unspecified site, without rupture: Secondary | ICD-10-CM

## 2017-12-31 ENCOUNTER — Other Ambulatory Visit (HOSPITAL_COMMUNITY): Payer: Self-pay | Admitting: *Deleted

## 2017-12-31 DIAGNOSIS — I712 Thoracic aortic aneurysm, without rupture, unspecified: Secondary | ICD-10-CM

## 2018-03-06 ENCOUNTER — Ambulatory Visit (HOSPITAL_COMMUNITY)
Admission: RE | Admit: 2018-03-06 | Discharge: 2018-03-06 | Disposition: A | Discharge status: Home / Self Care | Payer: Medicare Other | Source: Ambulatory Visit | Attending: Internal Medicine | Admitting: Internal Medicine

## 2018-03-06 ENCOUNTER — Other Ambulatory Visit: Payer: Self-pay

## 2018-03-06 ENCOUNTER — Encounter (HOSPITAL_COMMUNITY): Payer: Self-pay | Admitting: Internal Medicine

## 2018-03-06 VITALS — BP 133/59 | HR 60 | Wt 192.0 lb

## 2018-03-06 DIAGNOSIS — Q2543 Congenital aneurysm of aorta: Secondary | ICD-10-CM | POA: Insufficient documentation

## 2018-03-06 DIAGNOSIS — R0683 Snoring: Secondary | ICD-10-CM | POA: Diagnosis not present

## 2018-03-06 DIAGNOSIS — I10 Essential (primary) hypertension: Secondary | ICD-10-CM | POA: Insufficient documentation

## 2018-03-06 DIAGNOSIS — I700 Atherosclerosis of aorta: Secondary | ICD-10-CM | POA: Insufficient documentation

## 2018-03-06 DIAGNOSIS — G4733 Obstructive sleep apnea (adult) (pediatric): Secondary | ICD-10-CM | POA: Diagnosis not present

## 2018-03-06 DIAGNOSIS — R011 Cardiac murmur, unspecified: Secondary | ICD-10-CM | POA: Insufficient documentation

## 2018-03-06 DIAGNOSIS — I716 Thoracoabdominal aortic aneurysm, without rupture, unspecified: Secondary | ICD-10-CM

## 2018-03-06 DIAGNOSIS — R0989 Other specified symptoms and signs involving the circulatory and respiratory systems: Secondary | ICD-10-CM | POA: Diagnosis not present

## 2018-03-06 DIAGNOSIS — I77810 Thoracic aortic ectasia: Secondary | ICD-10-CM

## 2018-03-06 DIAGNOSIS — E785 Hyperlipidemia, unspecified: Secondary | ICD-10-CM | POA: Diagnosis not present

## 2018-03-06 DIAGNOSIS — Z79899 Other long term (current) drug therapy: Secondary | ICD-10-CM | POA: Diagnosis not present

## 2018-03-06 DIAGNOSIS — I2510 Atherosclerotic heart disease of native coronary artery without angina pectoris: Secondary | ICD-10-CM | POA: Insufficient documentation

## 2018-03-06 DIAGNOSIS — Z955 Presence of coronary angioplasty implant and graft: Secondary | ICD-10-CM | POA: Diagnosis present

## 2018-03-06 DIAGNOSIS — Z7982 Long term (current) use of aspirin: Secondary | ICD-10-CM | POA: Diagnosis not present

## 2018-03-06 LAB — BASIC METABOLIC PANEL
ANION GAP: 8 (ref 5–15)
BUN: 20 mg/dL (ref 8–23)
CALCIUM: 9.3 mg/dL (ref 8.9–10.3)
CHLORIDE: 109 mmol/L (ref 98–111)
CO2: 26 mmol/L (ref 22–32)
Creatinine, Ser: 1.32 mg/dL — ABNORMAL HIGH (ref 0.61–1.24)
GFR calc Af Amer: 57 mL/min — ABNORMAL LOW (ref >60)
GFR calc non Af Amer: 49 mL/min — ABNORMAL LOW (ref >60)
GLUCOSE: 117 mg/dL — AB (ref 70–99)
Potassium: 4.2 mmol/L (ref 3.5–5.1)
Sodium: 143 mmol/L (ref 135–145)

## 2018-03-06 NOTE — Patient Instructions (Signed)
Non-Cardiac CT scanning, (CAT scanning), is a noninvasive, special x-ray that produces cross-sectional images of the body using x-rays and a computer. CT scans help physicians diagnose and treat medical conditions. For some CT exams, a contrast material is used to enhance visibility in the area of the body being studied. CT scans provide greater clarity and reveal more details than regular x-ray exams.  We will contact you in 9 months to schedule your next appointment.

## 2018-03-06 NOTE — Progress Notes (Signed)
Advanced HF CLINIC NOTE  Patient ID: Chris Guzman, male   DOB: [birth year], 84 y.o.   MRN: [id number] PCP: Dr. Jonelle Sports Cardiology: Shamirah Ivan  HPI: Chris Guzman is a 84 y.o. male with h/o HTN, HL and OSA. Also with h/o coronary artery disease s/p DES to LAD and PCI RCA/PTCA diagonal in Sept 2010. In November 2010 had relook cath showing patent stent and small vessel dz, not favorable for PCI.  Carotid dopplers (3/13): Minimal disease.  Echo (10/15): EF 60-65%, mild dilation of aortic root and ascending aorta (4.2/4.0 cm), normal RV size and systolic function, aortic sclerosis without stenosis.   Wasn't feeling well this summer and had Myoview 6/17:  Nuclear stress EF: 55%.  There was no ST segment deviation noted during stress.  There is a small defect of mild severity present in the basal inferior and mid inferior location. The defect is partially-reversible. This is most consistent with variations in diaphragmatic attenuation.  This is a low risk study.  The left ventricular ejection fraction is normal (55-65%).   Echo 11/17: EF 65-70%. Grade I DD. Aortic sclerosis without stenosis. AoRoot 4.4cm   He presents today for regular follow up. Feeling good overall. Working out 3 times a week. Using weights and treadmill, for a least a mile. Denies lightheadedness or dizziness. Denies any CP or SOB. Occasional peripheral edema. Not on diuretics. Watches salt and fluid intake when he has this an it goes away. Still struggling with memory. Taking all medications as directed.   Review of systems complete and found to be negative unless listed in HPI.    Past Medical History:  Diagnosis Date  . CAD (coronary artery disease)    aborted inf STEMI on treadmill 9/10; PCI of RCA, DES to LAD  with PTCA  of D2 9/10; relook cath 11/10-patent LAD stent with small branch vessel and dis RCA disease  . Elevated PSA   . HTN (hypertension)   . Hyperlipidemia   . OSA (obstructive sleep apnea)    probable    Current Outpatient Medications  Medication Sig Dispense Refill  . amLODipine (NORVASC) 10 MG tablet Take 10 mg by mouth daily.      Marland Kitchen aspirin 81 MG tablet Take 81 mg by mouth daily.      . Cholecalciferol (VITAMIN D) 1000 UNITS capsule Take 1,000 Units by mouth daily.      . famotidine (PEPCID) 20 MG tablet Take 20 mg by mouth daily as needed for heartburn.     . fish oil-omega-3 fatty acids 1000 MG capsule Take 1 g by mouth 2 (two) times daily.     . folic acid (FOLVITE) 400 MCG tablet Take 800 mcg by mouth daily.     Marland Kitchen losartan (COZAAR) 50 MG tablet Take 1 tablet (50 mg total) by mouth daily. Takes 1/2 tablet Once Daily 30 tablet 3  . Multiple Vitamins-Minerals (CENTRUM SILVER PO) Take 1 capsule by mouth daily.      . nitroGLYCERIN (NITROSTAT) 0.4 MG SL tablet Place 1 tablet (0.4 mg total) under the tongue every 5 (five) minutes as needed for chest pain. 25 tablet 3  . pyridOXINE (VITAMIN B-6) 50 MG tablet Take 25 mg by mouth daily.     . rosuvastatin (CRESTOR) 40 MG tablet Take 20 mg by mouth daily.    . vitamin B-12 (CYANOCOBALAMIN) 250 MCG tablet Take 250 mcg by mouth daily.       No current facility-administered medications for this encounter.    Vitals:  03/06/18 0925  BP: (!) 133/59  Pulse: 60  SpO2: 100%  Weight: 87.1 kg (192 lb)   Wt Readings from Last 3 Encounters:  03/06/18 87.1 kg (192 lb)  06/09/17 86.1 kg (189 lb 12.8 oz)  03/14/17 85.9 kg (189 lb 6.4 oz)   Physical Exam: General: Well appearing. No resp difficulty. HEENT: Normal Neck: Supple. JVP 5-6. Carotids 2+ bilat; no bruits. No thyromegaly or nodule noted. Cor: PMI nondisplaced. RRR, Soft SEM at RUSB, S2 OK.  Lungs: CTAB, normal effort. Abdomen: Soft, non-tender, non-distended, no HSM. No bruits or masses. +BS  Extremities: No cyanosis, clubbing, or rash. R and LLE no edema.  Neuro: Alert & orientedx3, cranial nerves grossly intact. moves all 4 extremities w/o difficulty. Affect pleasant    ASSESSMENT & PLAN: 1) CAD: s/p DES to LAD in 2010, also with PTCA to RCA and diagonal in Sept. 2010.  - No s/s of ischemia.    - He notes that his anginal equivalent is stomach pain. He denies any stomach pain.  - Nuclear study in 11/2015 nonischemic.  - Continue ASA, Statin.  - LDL 67 in 9/18 - Continue to encouraged exercise.   2) HTN:  - Slightly elevated in clinic, but stable at home. Coninue losartan to 50 daily   3) Hyperlipidemia:  - Lipids stable 03/14/2017.   4) Snoring:  - Likely OSA, but has refused sleep study.    5) Carotid bruit:  - PCP follows carotid ultrasounds.   6) Aortic area murmur:  - Previous echo reviewed by Dr. Gala RomneyBensimhon. AoV sclerosis without stenosis.   7) Aortic root aneurysm:  - Slight increase by echo. - Chest CT 06/2017 with aortic root 4.3 and recommended follow up.  - Will repeat CT as planned.  RTC 6-9 months. Lipids next month fasting at PCP visit.   Graciella FreerMichael Andrew Tillery, PA-C   03/06/2018  Patient seen and examined with the above-signed Advanced Practice Provider and/or Housestaff. I personally reviewed laboratory data, imaging studies and relevant notes. I independently examined the patient and formulated the important aspects of the plan. I have edited the note to reflect any of my changes or salient points. I have personally discussed the plan with the patient and/or family.  Doing very well. CP stable without angina. BP well controlled. Agree with CT to f/u on aortic root aneursym.   Arvilla Meresaniel Gicela Schwarting, MD  11:43 AM

## 2018-03-13 ENCOUNTER — Ambulatory Visit (HOSPITAL_COMMUNITY): Payer: Medicare Other

## 2018-03-18 ENCOUNTER — Ambulatory Visit (HOSPITAL_COMMUNITY)
Admission: RE | Admit: 2018-03-18 | Discharge: 2018-03-18 | Disposition: A | Discharge status: Home / Self Care | Payer: Medicare Other | Source: Ambulatory Visit | Attending: Internal Medicine | Admitting: Internal Medicine

## 2018-03-18 ENCOUNTER — Encounter (HOSPITAL_COMMUNITY): Payer: Self-pay

## 2018-03-18 DIAGNOSIS — I700 Atherosclerosis of aorta: Secondary | ICD-10-CM | POA: Insufficient documentation

## 2018-03-18 DIAGNOSIS — R911 Solitary pulmonary nodule: Secondary | ICD-10-CM | POA: Insufficient documentation

## 2018-03-18 DIAGNOSIS — I77810 Thoracic aortic ectasia: Secondary | ICD-10-CM | POA: Diagnosis not present

## 2018-03-18 DIAGNOSIS — I2510 Atherosclerotic heart disease of native coronary artery without angina pectoris: Secondary | ICD-10-CM | POA: Diagnosis not present

## 2018-03-18 DIAGNOSIS — I716 Thoracoabdominal aortic aneurysm, without rupture, unspecified: Secondary | ICD-10-CM

## 2018-03-18 MED ORDER — IOHEXOL 300 MG/ML  SOLN
75.0000 mL | Freq: Once | INTRAMUSCULAR | Status: AC | PRN
  Administered 2018-03-18: 75 mL via INTRAVENOUS

## 2018-07-28 ENCOUNTER — Other Ambulatory Visit (HOSPITAL_COMMUNITY): Payer: Self-pay | Admitting: *Deleted

## 2018-07-28 ENCOUNTER — Telehealth (HOSPITAL_COMMUNITY): Payer: Self-pay

## 2018-07-28 DIAGNOSIS — I2510 Atherosclerotic heart disease of native coronary artery without angina pectoris: Secondary | ICD-10-CM

## 2018-07-28 NOTE — Telephone Encounter (Signed)
Error

## 2018-07-28 NOTE — Addendum Note (Signed)
Addended by: Mancel BaleHEATH, Subrena Devereux C on: 07/28/2018 04:17 PM   Modules accepted: Orders

## 2018-08-04 ENCOUNTER — Other Ambulatory Visit (HOSPITAL_COMMUNITY): Payer: Self-pay | Admitting: *Deleted

## 2018-08-04 DIAGNOSIS — I7121 Aneurysm of the ascending aorta, without rupture: Secondary | ICD-10-CM

## 2018-08-04 DIAGNOSIS — I719 Aortic aneurysm of unspecified site, without rupture: Secondary | ICD-10-CM

## 2018-08-04 DIAGNOSIS — I25119 Atherosclerotic heart disease of native coronary artery with unspecified angina pectoris: Secondary | ICD-10-CM

## 2018-08-04 DIAGNOSIS — I77810 Thoracic aortic ectasia: Secondary | ICD-10-CM

## 2018-08-04 DIAGNOSIS — I359 Nonrheumatic aortic valve disorder, unspecified: Secondary | ICD-10-CM

## 2018-08-11 ENCOUNTER — Other Ambulatory Visit (HOSPITAL_COMMUNITY): Payer: Self-pay | Admitting: *Deleted

## 2018-08-11 ENCOUNTER — Telehealth (HOSPITAL_COMMUNITY): Payer: Self-pay | Admitting: *Deleted

## 2018-08-11 DIAGNOSIS — I719 Aortic aneurysm of unspecified site, without rupture: Secondary | ICD-10-CM

## 2018-08-11 DIAGNOSIS — I7121 Aneurysm of the ascending aorta, without rupture: Secondary | ICD-10-CM

## 2018-08-11 DIAGNOSIS — Q2543 Congenital aneurysm of aorta: Secondary | ICD-10-CM

## 2018-08-11 DIAGNOSIS — I779 Disorder of arteries and arterioles, unspecified: Secondary | ICD-10-CM

## 2018-08-11 NOTE — Telephone Encounter (Signed)
Chest CT denied by medicare because last 02/2018 CT was stable. The recommend 12 month recheck if pt is high risk and no follow up for low risk patient. Pt is eligible for CT 02/2019

## 2018-09-01 ENCOUNTER — Ambulatory Visit (HOSPITAL_COMMUNITY)
Admission: RE | Admit: 2018-09-01 | Discharge: 2018-09-01 | Disposition: A | Discharge status: Home / Self Care | Payer: Medicare Other | Source: Ambulatory Visit | Attending: Cardiology | Admitting: Cardiology

## 2018-09-01 ENCOUNTER — Encounter (HOSPITAL_COMMUNITY): Payer: Medicare Other

## 2018-09-01 VITALS — Wt 194.6 lb

## 2018-09-01 DIAGNOSIS — G4733 Obstructive sleep apnea (adult) (pediatric): Secondary | ICD-10-CM | POA: Diagnosis not present

## 2018-09-01 DIAGNOSIS — R0989 Other specified symptoms and signs involving the circulatory and respiratory systems: Secondary | ICD-10-CM | POA: Insufficient documentation

## 2018-09-01 DIAGNOSIS — I10 Essential (primary) hypertension: Secondary | ICD-10-CM | POA: Insufficient documentation

## 2018-09-01 DIAGNOSIS — Z79899 Other long term (current) drug therapy: Secondary | ICD-10-CM | POA: Insufficient documentation

## 2018-09-01 DIAGNOSIS — E785 Hyperlipidemia, unspecified: Secondary | ICD-10-CM | POA: Insufficient documentation

## 2018-09-01 DIAGNOSIS — Z955 Presence of coronary angioplasty implant and graft: Secondary | ICD-10-CM | POA: Insufficient documentation

## 2018-09-01 DIAGNOSIS — I25119 Atherosclerotic heart disease of native coronary artery with unspecified angina pectoris: Secondary | ICD-10-CM

## 2018-09-01 DIAGNOSIS — Z7982 Long term (current) use of aspirin: Secondary | ICD-10-CM | POA: Diagnosis not present

## 2018-09-01 DIAGNOSIS — I2510 Atherosclerotic heart disease of native coronary artery without angina pectoris: Secondary | ICD-10-CM

## 2018-09-01 DIAGNOSIS — R011 Cardiac murmur, unspecified: Secondary | ICD-10-CM | POA: Diagnosis not present

## 2018-09-01 DIAGNOSIS — I951 Orthostatic hypotension: Secondary | ICD-10-CM | POA: Diagnosis present

## 2018-09-01 DIAGNOSIS — Q2543 Congenital aneurysm of aorta: Secondary | ICD-10-CM | POA: Insufficient documentation

## 2018-09-01 NOTE — Progress Notes (Signed)
Advanced HF CLINIC NOTE  Patient ID: Chris Guzman, male   DOB: 07-16-1933, 85 y.o.   MRN: [id number] PCP: Dr. Jonelle Sports Cardiology: Bensimhon  HPI: Chris Guzman is a 85 y.o. male with h/o HTN, HL and OSA. Also with h/o coronary artery disease s/p DES to LAD and PCI RCA/PTCA diagonal in Sept 2010. In November 2010 had relook cath showing patent stent and small vessel dz, not favorable for PCI.  Carotid dopplers (3/13): Minimal disease.  Echo (10/15): EF 60-65%, mild dilation of aortic root and ascending aorta (4.2/4.0 cm), normal RV size and systolic function, aortic sclerosis without stenosis.   Wasn't feeling well this summer and had Myoview 6/17:  Nuclear stress EF: 55%.  There was no ST segment deviation noted during stress.  There is a small defect of mild severity present in the basal inferior and mid inferior location. The defect is partially-reversible. This is most consistent with variations in diaphragmatic attenuation.  This is a low risk study.  The left ventricular ejection fraction is normal (55-65%).   Echo 11/17: EF 65-70%. Grade I DD. Aortic sclerosis without stenosis. AoRoot 4.4cm   Today he presents for an acute visit for orthostatic hypotension. Last night he was taken to [facility] ED for presyncope. He was given IV fluids and discharged to home. 3 weeks ago he had cold that resolved with OTCs.  His wife then had Influenza A and she received Tamiflu. A week ago he developed another URI + influenza A but was not treated with Tamiflu. PCP started him on antibiotics. Poor appetite. No fever or chills today. Denies cough. Denies SOB. Denies dizziness today. Taking all medications.   Review of systems complete and found to be negative unless listed in HPI.    Past Medical History:  Diagnosis Date  . CAD (coronary artery disease)    aborted inf STEMI on treadmill 9/10; PCI of RCA, DES to LAD  with PTCA  of D2 9/10; relook cath 11/10-patent LAD stent with small branch  vessel and dis RCA disease  . Elevated PSA   . HTN (hypertension)   . Hyperlipidemia   . OSA (obstructive sleep apnea)    probable    Current Outpatient Medications  Medication Sig Dispense Refill  . aspirin 81 MG tablet Take 81 mg by mouth daily.      . Cholecalciferol (VITAMIN D) 1000 UNITS capsule Take 1,000 Units by mouth daily.      . famotidine (PEPCID) 20 MG tablet Take 20 mg by mouth daily as needed for heartburn.     . fish oil-omega-3 fatty acids 1000 MG capsule Take 1 g by mouth 2 (two) times daily.     . folic acid (FOLVITE) 400 MCG tablet Take 800 mcg by mouth daily.     Marland Kitchen losartan (COZAAR) 50 MG tablet Take 1 tablet (50 mg total) by mouth daily. Takes 1/2 tablet Once Daily 30 tablet 3  . Multiple Vitamins-Minerals (CENTRUM SILVER PO) Take 1 capsule by mouth daily.      Marland Kitchen pyridOXINE (VITAMIN B-6) 50 MG tablet Take 25 mg by mouth daily.     . rosuvastatin (CRESTOR) 40 MG tablet Take 20 mg by mouth daily.    . vitamin B-12 (CYANOCOBALAMIN) 250 MCG tablet Take 250 mcg by mouth daily.      . nitroGLYCERIN (NITROSTAT) 0.4 MG SL tablet Place 1 tablet (0.4 mg total) under the tongue every 5 (five) minutes as needed for chest pain. 25 tablet 3   No current  facility-administered medications for this encounter.    Vitals:   09/01/18 1454  SpO2: 99%  Weight: 88.3 kg (194 lb 9.6 oz)   Wt Readings from Last 3 Encounters:  09/01/18 88.3 kg (194 lb 9.6 oz)  03/06/18 87.1 kg (192 lb)  06/09/17 86.1 kg (189 lb 12.8 oz)   Physical Exam: General:  . No resp difficulty HEENT: normal Neck: supple. no JVD. Carotids 2+ bilat; no bruits. No lymphadenopathy or thryomegaly appreciated. Cor: PMI nondisplaced. Regular rate & rhythm. No rubs, gallops or murmurs. Lungs: clear Abdomen: soft, nontender, nondistended. No hepatosplenomegaly. No bruits or masses. Good bowel sounds. Extremities: no cyanosis, clubbing, rash, trace ankle edema Neuro: alert & orientedx3, cranial nerves grossly  intact. moves all 4 extremities w/o difficulty. Affect pleasant  ASSESSMENT & PLAN: 1) CAD: s/p DES to LAD in 2010, also with PTCA to RCA and diagonal in Sept. 2010.  - Denies chest pain/stomach pain.  - He notes that his anginal equivalent is stomach pain. He denies any stomach pain.  - Nuclear study in 11/2015 nonischemic.  - Continue ASA, Statin.  - LDL 67 in 9/18 - Continue to encouraged exercise.   2) HTN:  -Low today likely due to poor po intake. Hold amlodipine for now.   3) Hyperlipidemia:  - Lipids stable 03/14/2017.   4) Snoring:  - Likely OSA, but has refused sleep study.    5) Carotid bruit:  - PCP follows carotid ultrasounds.   6) Aortic area murmur:  - Previous echo reviewed by Dr. Gala Romney. AoV sclerosis without stenosis.   7) Aortic root aneurysm:  - Slight increase by echo. - Chest CT 06/2017 with aortic root 4.3 and recommended follow up.  - Will repeat CT as planned.  8) Orthostatic Hypotension  Suspect this is in the setting of flu/ URI. Suspect he is dehydrated from illness. Last night he received 1.5 liters IV fluids at [facility] ED. Completing antibiotic course.  For now I will stop amlodipine until this resolves. I have instructed him to call back next week with BP readings. Encouraged to increase po intake.     RTC next month with scheduled visit with Dr Tenna Child, NP   09/01/2018

## 2018-09-09 ENCOUNTER — Telehealth (HOSPITAL_COMMUNITY): Payer: Self-pay | Admitting: Cardiology

## 2018-09-09 MED ORDER — AMLODIPINE BESYLATE 10 MG PO TABS: 10.0000 mg | ORAL_TABLET | Freq: Every day | ORAL | 3 refills | Status: DC

## 2018-09-09 NOTE — Telephone Encounter (Signed)
Patients wife called with b/p records as instructed at last OV 3/10  BP  3/12 150/70 3/13 130/78 3/14 140/70 3/15 120/70 3/16 120/70 3/17 145/80 3/18 130/60  Pt is asymptomatic and not longer dizzy or light headed. Reports all viral symptoms are much better    Should patient restart norvsac?

## 2018-09-09 NOTE — Telephone Encounter (Signed)
Yes. Please ask him to continue to track BP, to make sure we do not need to adjust dose.  Thank you! Casimiro Needle [address] [place], [place]

## 2018-09-09 NOTE — Telephone Encounter (Signed)
Patients wife returned call to confirm she received message and aware of instructions

## 2018-09-09 NOTE — Telephone Encounter (Signed)
Detailed message left with instructions to restart norvasc, continue to check b/p x 1 week and call with reading to ensure medications does not need to be further titrated.  Will follow up with patient to ensure patient he received message

## 2018-09-29 ENCOUNTER — Ambulatory Visit (HOSPITAL_COMMUNITY)
Admission: RE | Admit: 2018-09-29 | Discharge: 2018-09-29 | Disposition: A | Discharge status: Home / Self Care | Payer: Medicare Other | Source: Ambulatory Visit | Attending: Internal Medicine | Admitting: Internal Medicine

## 2018-09-29 ENCOUNTER — Other Ambulatory Visit: Payer: Self-pay

## 2018-09-29 DIAGNOSIS — I7121 Aneurysm of the ascending aorta, without rupture: Secondary | ICD-10-CM

## 2018-09-29 DIAGNOSIS — I10 Essential (primary) hypertension: Secondary | ICD-10-CM

## 2018-09-29 DIAGNOSIS — E785 Hyperlipidemia, unspecified: Secondary | ICD-10-CM

## 2018-09-29 DIAGNOSIS — I719 Aortic aneurysm of unspecified site, without rupture: Secondary | ICD-10-CM

## 2018-09-29 DIAGNOSIS — I2510 Atherosclerotic heart disease of native coronary artery without angina pectoris: Secondary | ICD-10-CM

## 2018-09-29 NOTE — Progress Notes (Signed)
Heart Failure TeleHealth Note  Due to national recommendations of social distancing due to COVID 19, Audio/video telehealth visit is felt to be most appropriate for this patient at this time.  See MyChart message from today for patient consent regarding telehealth for [facility].  Date:  09/29/2018   ID:  Chris Guzman, DOB 08/15/1933, MRN [id number]  Location: Home  Provider location: [address], [place] Type of Visit: Established  PCP:  Pomposini, Rande Brunt, MD  Cardiologist:  No primary care provider on file. Primary HF: DB  Chief Complaint: CAD   History of Present Illness: Chris Guzman is a 85 y.o. male who presents via audio/video conferencing for a telehealth visit today.   Chris Guzman is a 85 y.o. male with h/o HTN, HL and OSA. Also with h/o coronary artery disease s/p DES to LAD and PCI RCA/PTCA diagonal in Sept 2010. In November 2010 had relook cath showing patent stent and small vessel dz, not favorable for PCI.  Carotid dopplers (3/13): Minimal disease.  Echo (10/15): EF 60-65%, mild dilation of aortic root and ascending aorta (4.2/4.0 cm), normal RV size and systolic function, aortic sclerosis without stenosis.   Myoview 6/17:  Nuclear stress EF: 55%.  There was no ST segment deviation noted during stress.  There is a small defect of mild severity present in the basal inferior and mid inferior location. The defect is partially-reversible. This is most consistent with variations in diaphragmatic attenuation.  This is a low risk study.  The left ventricular ejection fraction is normal (55-65%).  Echo 11/17: EF 65-70%. Grade I DD. Aortic sclerosis without stenosis. AoRoot 4.4cm   Chest CT 9/19: Stable AoRoot aneurysm 4.1cm  In early March had a work-in visit for orthostatic hypotension in the setting of the flu. Got 2L IVF. Amlodipine stopped for a week and then he started back up. BP now 130/70. Feels very good. Working out in yard.  No CP or SOB. Weight stable. Chronic dry cough. Does get tired easily.    He denies symptoms of cough, fevers, chills, or new SOB worrisome for COVID 19.    Past Medical History:  Diagnosis Date  . CAD (coronary artery disease)    aborted inf STEMI on treadmill 9/10; PCI of RCA, DES to LAD  with PTCA  of D2 9/10; relook cath 11/10-patent LAD stent with small branch vessel and dis RCA disease  . Elevated PSA   . HTN (hypertension)   . Hyperlipidemia   . OSA (obstructive sleep apnea)    probable   No past surgical history on file.   Current Outpatient Medications  Medication Sig Dispense Refill  . amLODipine (NORVASC) 10 MG tablet Take 1 tablet (10 mg total) by mouth daily. 180 tablet 3  . aspirin 81 MG tablet Take 81 mg by mouth daily.      . Cholecalciferol (VITAMIN D) 1000 UNITS capsule Take 1,000 Units by mouth daily.      . famotidine (PEPCID) 20 MG tablet Take 20 mg by mouth daily as needed for heartburn.     . fish oil-omega-3 fatty acids 1000 MG capsule Take 1 g by mouth 2 (two) times daily.     . folic acid (FOLVITE) 400 MCG tablet Take 800 mcg by mouth daily.     Marland Kitchen losartan (COZAAR) 50 MG tablet Take 1 tablet (50 mg total) by mouth daily. Takes 1/2 tablet Once Daily 30 tablet 3  . Multiple Vitamins-Minerals (CENTRUM SILVER PO) Take  1 capsule by mouth daily.      . nitroGLYCERIN (NITROSTAT) 0.4 MG SL tablet Place 1 tablet (0.4 mg total) under the tongue every 5 (five) minutes as needed for chest pain. 25 tablet 3  . pyridOXINE (VITAMIN B-6) 50 MG tablet Take 25 mg by mouth daily.     . rosuvastatin (CRESTOR) 40 MG tablet Take 20 mg by mouth daily.    . vitamin B-12 (CYANOCOBALAMIN) 250 MCG tablet Take 250 mcg by mouth daily.       No current facility-administered medications for this encounter.     Allergies:   Ace inhibitors   Social History:  The patient  reports that he has never smoked. He has never used smokeless tobacco. He reports that he does not drink alcohol or  use drugs.   Family History:  The patient's family history includes Heart attack in his sister; Heart attack (age of onset: 7060) in his father; Lung cancer in his mother; Pulmonary embolism in his father.   ROS:  Please see the history of present illness.   All other systems are personally reviewed and negative.   Exam:  (Video/Tele Health Call; Exam is subjective and or/visual.) General:  Talks in full sentences. No resp difficulty. Neuro: Alert & oriented x 3.   Recent Labs: 03/06/2018: BUN 20; Creatinine, Ser 1.32; Potassium 4.2; Sodium 143  Personally reviewed   Wt Readings from Last 3 Encounters:  09/01/18 88.3 kg (194 lb 9.6 oz)  03/06/18 87.1 kg (192 lb)  06/09/17 86.1 kg (189 lb 12.8 oz)      Other studies personally reviewed:   ASSESSMENT AND PLAN:  1) CAD: s/p DES to LAD in 2010, also with PTCA to RCA and diagonal in Sept. 2010.  - No s/s ischemia - He notes that his anginal equivalent is stomach pain. He denies any stomach pain.  - Nuclear study in 11/2015 nonischemic.  - Continue ASA, Statin.   2) HTN:  -Stable. Back on amlodipine   3) Hyperlipidemia:  - Followed by PCP Goal LDL < 70  4) Snoring:  - Likely OSA, but has refused sleep study.    5) Carotid bruit:  - PCP follows carotid ultrasounds.   6) Aortic area murmur:  - Previous echo reviewed by Dr. Gala RomneyBensimhon. AoV sclerosis without stenosis.   7) Aortic root aneurysm:  - Chest CT 02/2018 with aortic root 4.1 Stable   COVID screen The patient does not have any symptoms that suggest any further testing/ screening at this time.  Social distancing reinforced today.  Recommended follow-up:  6-9 months  Patient Risk: After full review of this patients clinical status, I feel that they are at moderate risk for cardiac decompensation at this time.  Today, I have spent 14 minutes with the patient with telehealth technology discussing the above.    Signed, Arvilla Meresaniel Maeson Purohit, MD  09/29/2018 8:57 AM   [facility] [address] [facility] [place] [address] [phone] (office) [phone] (fax)

## 2018-09-29 NOTE — Addendum Note (Signed)
Encounter addended by: Nicole Cella, RN on: 09/29/2018 9:55 AM  Actions taken: Clinical Note Signed

## 2018-09-29 NOTE — Progress Notes (Signed)
Spoke to pt and daughter. Pt denies any needs at this time. Denies need for refills. Questions answered. Requested for them to call us back for an appointment in 6-9 months. Pt verbalized understanding.

## 2018-12-07 DIAGNOSIS — K5730 Diverticulosis of large intestine without perforation or abscess without bleeding: Secondary | ICD-10-CM | POA: Insufficient documentation

## 2018-12-07 DIAGNOSIS — I878 Other specified disorders of veins: Secondary | ICD-10-CM | POA: Insufficient documentation

## 2019-03-17 ENCOUNTER — Telehealth (HOSPITAL_COMMUNITY): Payer: Self-pay | Admitting: Cardiology

## 2019-03-17 NOTE — Telephone Encounter (Signed)
Patients wife called to report increased swelling in LE. Painful at times. Would like to schedule follow up.  Next available provided for 10/22 @ 1140

## 2019-04-03 ENCOUNTER — Other Ambulatory Visit (HOSPITAL_COMMUNITY): Payer: Self-pay | Admitting: Internal Medicine

## 2019-04-15 ENCOUNTER — Encounter (HOSPITAL_COMMUNITY): Payer: Self-pay | Admitting: Internal Medicine

## 2019-04-15 ENCOUNTER — Ambulatory Visit (HOSPITAL_COMMUNITY)
Admission: RE | Admit: 2019-04-15 | Discharge: 2019-04-15 | Disposition: A | Discharge status: Home / Self Care | Payer: Medicare Other | Source: Ambulatory Visit | Attending: Internal Medicine | Admitting: Internal Medicine

## 2019-04-15 ENCOUNTER — Other Ambulatory Visit: Payer: Self-pay

## 2019-04-15 VITALS — BP 135/61 | HR 69 | Wt 198.8 lb

## 2019-04-15 DIAGNOSIS — I10 Essential (primary) hypertension: Secondary | ICD-10-CM | POA: Diagnosis not present

## 2019-04-15 DIAGNOSIS — Z8249 Family history of ischemic heart disease and other diseases of the circulatory system: Secondary | ICD-10-CM | POA: Diagnosis not present

## 2019-04-15 DIAGNOSIS — Z7982 Long term (current) use of aspirin: Secondary | ICD-10-CM | POA: Insufficient documentation

## 2019-04-15 DIAGNOSIS — Z7901 Long term (current) use of anticoagulants: Secondary | ICD-10-CM | POA: Diagnosis not present

## 2019-04-15 DIAGNOSIS — I2510 Atherosclerotic heart disease of native coronary artery without angina pectoris: Secondary | ICD-10-CM | POA: Insufficient documentation

## 2019-04-15 DIAGNOSIS — I719 Aortic aneurysm of unspecified site, without rupture: Secondary | ICD-10-CM | POA: Diagnosis not present

## 2019-04-15 DIAGNOSIS — R0989 Other specified symptoms and signs involving the circulatory and respiratory systems: Secondary | ICD-10-CM | POA: Insufficient documentation

## 2019-04-15 DIAGNOSIS — R011 Cardiac murmur, unspecified: Secondary | ICD-10-CM | POA: Insufficient documentation

## 2019-04-15 DIAGNOSIS — I7121 Aneurysm of the ascending aorta, without rupture: Secondary | ICD-10-CM

## 2019-04-15 DIAGNOSIS — Z79899 Other long term (current) drug therapy: Secondary | ICD-10-CM | POA: Diagnosis not present

## 2019-04-15 DIAGNOSIS — E785 Hyperlipidemia, unspecified: Secondary | ICD-10-CM | POA: Insufficient documentation

## 2019-04-15 DIAGNOSIS — Z955 Presence of coronary angioplasty implant and graft: Secondary | ICD-10-CM | POA: Diagnosis not present

## 2019-04-15 DIAGNOSIS — Q2543 Congenital aneurysm of aorta: Secondary | ICD-10-CM | POA: Insufficient documentation

## 2019-04-15 LAB — COMPREHENSIVE METABOLIC PANEL
ALT: 23 U/L (ref 0–44)
AST: 27 U/L (ref 15–41)
Albumin: 4.1 g/dL (ref 3.5–5.0)
Alkaline Phosphatase: 43 U/L (ref 38–126)
Anion gap: 10 (ref 5–15)
BUN: 17 mg/dL (ref 8–23)
CO2: 24 mmol/L (ref 22–32)
Calcium: 9.2 mg/dL (ref 8.9–10.3)
Chloride: 105 mmol/L (ref 98–111)
Creatinine, Ser: 1.29 mg/dL — ABNORMAL HIGH (ref 0.61–1.24)
GFR calc Af Amer: 59 mL/min — ABNORMAL LOW (ref >60)
GFR calc non Af Amer: 51 mL/min — ABNORMAL LOW (ref >60)
Glucose, Bld: 118 mg/dL — ABNORMAL HIGH (ref 70–99)
Potassium: 3.8 mmol/L (ref 3.5–5.1)
Sodium: 139 mmol/L (ref 135–145)
Total Bilirubin: 1.3 mg/dL — ABNORMAL HIGH (ref 0.3–1.2)
Total Protein: 6.7 g/dL (ref 6.5–8.1)

## 2019-04-15 LAB — LIPID PANEL
Cholesterol: 124 mg/dL (ref 0–200)
HDL: 58 mg/dL (ref >40)
LDL Cholesterol: 55 mg/dL (ref 0–99)
Total CHOL/HDL Ratio: 2.1 RATIO
Triglycerides: 55 mg/dL (ref <150)
VLDL: 11 mg/dL (ref 0–40)

## 2019-04-15 LAB — CBC
HCT: 43.3 % (ref 39.0–52.0)
Hemoglobin: 14.2 g/dL (ref 13.0–17.0)
MCH: 32.7 pg (ref 26.0–34.0)
MCHC: 32.8 g/dL (ref 30.0–36.0)
MCV: 99.8 fL (ref 80.0–100.0)
Platelets: 160 10*3/uL (ref 150–400)
RBC: 4.34 MIL/uL (ref 4.22–5.81)
RDW: 12.7 % (ref 11.5–15.5)
WBC: 7.3 10*3/uL (ref 4.0–10.5)
nRBC: 0 % (ref 0.0–0.2)

## 2019-04-15 MED ORDER — ROSUVASTATIN CALCIUM 40 MG PO TABS: 40.0000 mg | ORAL_TABLET | Freq: Every day | ORAL | 3 refills | Status: DC

## 2019-04-15 NOTE — Patient Instructions (Addendum)
STOP Zetia  INCREASE Crestor to 40 mg, one tab daily  Labs today We will only contact you if something comes back abnormal or we need to make some changes. Otherwise no news is good news!  Your physician has requested that you have an echocardiogram. Echocardiography is a painless test that uses sound waves to create images of your heart. It provides your doctor with information about the size and shape of your heart and how well your heart's chambers and valves are working. This procedure takes approximately one hour. There are no restrictions for this procedure.  Your physician recommends that you schedule a follow-up appointment in: 12 months with Dr Haroldine Laws  At the [facility], you and your health needs are our priority. As part of our continuing mission to provide you with exceptional heart care, we have created designated Provider Care Teams. These Care Teams include your primary Cardiologist (physician) and Advanced Practice Providers (APPs- Physician Assistants and Nurse Practitioners) who all work together to provide you with the care you need, when you need it.   You may see any of the following providers on your designated Care Team at your next follow up: Marland Kitchen Dr Glori Bickers . Dr Loralie Champagne . Darrick Grinder, NP . Lyda Jester, PA   Please be sure to bring in all your medications bottles to every appointment.

## 2019-04-15 NOTE — Progress Notes (Signed)
[facility] Note  Date:  04/15/2019   ID:  Chris Guzman, DOB 1934/03/29, MRN [id number]  Location: Home  Provider location: [address] [place] Type of Visit: Established  PCP:  Pomposini, Rande Brunt, MD  Cardiologist:  No primary care provider on file. Primary HF: DB  Chief Complaint: CAD   History of Present Illness: Chris Guzman is a 85 y.o. male who presents via audio/video conferencing for a telehealth visit today.   Chris Guzman is a 85 y.o. male with h/o HTN, HL and OSA. Also with h/o coronary artery disease s/p DES to LAD and PCI RCA/PTCA diagonal in Sept 2010. In November 2010 had relook cath showing patent stent and small vessel dz, not favorable for PCI.  Carotid dopplers (3/13): Minimal disease.  Echo (10/15): EF 60-65%, mild dilation of aortic root and ascending aorta (4.2/4.0 cm), normal RV size and systolic function, aortic sclerosis without stenosis.   Myoview 6/17:  Nuclear stress EF: 55%.  There was no ST segment deviation noted during stress.  There is a small defect of mild severity present in the basal inferior and mid inferior location. The defect is partially-reversible. This is most consistent with variations in diaphragmatic attenuation.  This is a low risk study.  The left ventricular ejection fraction is normal (55-65%).  Echo 11/17: EF 65-70%. Grade I DD. Aortic sclerosis without stenosis. 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 Says he feels good. Working in yard. No CP or SOB. His PCP started Zetia in June and it is very expensive. Occasional edema in R ankle    Past Medical History:  Diagnosis Date  . CAD (coronary artery disease)    aborted inf STEMI on treadmill 9/10; PCI of RCA, DES to LAD  with PTCA  of D2 9/10; relook cath 11/10-patent LAD stent with small branch vessel and dis RCA disease  . Elevated PSA   . HTN (hypertension)   .  Hyperlipidemia   . OSA (obstructive sleep apnea)    probable   No past surgical history on file.   Current Outpatient Medications  Medication Sig Dispense Refill  . amLODipine (NORVASC) 10 MG tablet Take 1 tablet (10 mg total) by mouth daily. 180 tablet 3  . aspirin 81 MG tablet Take 81 mg by mouth daily.      . Cholecalciferol (VITAMIN D) 1000 UNITS capsule Take 1,000 Units by mouth daily.      . famotidine (PEPCID) 20 MG tablet Take 20 mg by mouth daily as needed for heartburn.     . fish oil-omega-3 fatty acids 1000 MG capsule Take 1 g by mouth 2 (two) times daily.     . folic acid (FOLVITE) 400 MCG tablet Take 800 mcg by mouth daily.     Marland Kitchen losartan (COZAAR) 50 MG tablet Take 1 tablet (50 mg total) by mouth daily. Takes 1/2 tablet Once Daily 30 tablet 3  . Multiple Vitamins-Minerals (CENTRUM SILVER PO) Take 1 capsule by mouth daily.      Marland Kitchen pyridOXINE (VITAMIN B-6) 50 MG tablet Take 25 mg by mouth daily.     . rosuvastatin (CRESTOR) 40 MG tablet Take 1/2 (one-half) tablet by mouth once daily 45 tablet 0  . vitamin B-12 (CYANOCOBALAMIN) 250 MCG tablet Take 250 mcg by mouth daily.      . nitroGLYCERIN (NITROSTAT) 0.4 MG SL tablet Place 1 tablet (0.4 mg total)  under the tongue every 5 (five) minutes as needed for chest pain. (Patient not taking: Reported on 04/15/2019) 25 tablet 3   No current facility-administered medications for this encounter.     Allergies:   Ace inhibitors   Social History:  The patient  reports that he has never smoked. He has never used smokeless tobacco. He reports that he does not drink alcohol or use drugs.   Family History:  The patient's family history includes Heart attack in his sister; Heart attack (age of onset: 77) in his father; Lung cancer in his mother; Pulmonary embolism in his father.   ROS:  Please see the history of present illness.   All other systems are personally reviewed and negative.   Vitals:   04/15/19 1156  BP: 135/61  Pulse: 69   SpO2: 98%    Exam:   General:  Well appearing. No resp difficulty HEENT: normal Neck: supple. no JVD. Carotids 2+ bilat; no bruits. No lymphadenopathy or thryomegaly appreciated. Cor: PMI nondisplaced. Regular rate & rhythm. Soft SEM at RUSB S2 ok  Lungs: clear Abdomen: soft, nontender, nondistended. No hepatosplenomegaly. No bruits or masses. Good bowel sounds. Extremities: no cyanosis, clubbing, rash, trace ankle edema on R Neuro: alert & orientedx3, cranial nerves grossly intact. moves all 4 extremities w/o difficulty. Affect pleasant   Recent Labs: No results found for requested labs within last 8760 hours.  Personally reviewed   Wt Readings from Last 3 Encounters:  04/15/19 90.2 kg (198 lb 12.8 oz)  09/01/18 88.3 kg (194 lb 9.6 oz)  03/06/18 87.1 kg (192 lb)      Other studies personally reviewed:   ASSESSMENT AND PLAN:  1) CAD: s/p DES to LAD in 2010, also with PTCA to RCA and diagonal in Sept. 2010.  - Doing well. No s/s ischemia - He notes that his anginal equivalent is stomach pain. He denies any stomach pain.  - Nuclear study in 11/2015 nonischemic.  - Continue ASA, Statin. PCP has written for zetia but he cannot afford. Will increase Crestor from 20 to 40.   2) HTN:  -Blood pressure well controlled. Continue current regimen.  3) Hyperlipidemia:  - Followed by PCP Goal LDL < 70.- Continue ASA, Statin. PCP has written for zetia but he cannot afford. Will increase Crestor from 20 to 40.   4) Snoring:  - Likely OSA, but has refused sleep study.    5) Carotid bruit:  - PCP follows carotid ultrasounds.   6) Aortic area murmur:  -Echo with AoV sclerosis without stenosis.  - Due for repeat  7) Aortic root aneurysm:  - Chest CT 02/2018 with aortic root 4.1 Stable - check echo    Signed, Glori Bickers, MD  04/15/2019 12:02 PM  [facility] [place] [address] [phone] (office)  [phone] (fax)

## 2019-04-16 ENCOUNTER — Other Ambulatory Visit (HOSPITAL_COMMUNITY): Payer: Self-pay | Admitting: Internal Medicine

## 2019-04-26 ENCOUNTER — Other Ambulatory Visit: Payer: Self-pay

## 2019-04-26 ENCOUNTER — Ambulatory Visit (HOSPITAL_COMMUNITY)
Admission: RE | Admit: 2019-04-26 | Discharge: 2019-04-26 | Disposition: A | Discharge status: Home / Self Care | Payer: Medicare Other | Source: Ambulatory Visit | Attending: Internal Medicine | Admitting: Internal Medicine

## 2019-04-26 DIAGNOSIS — E785 Hyperlipidemia, unspecified: Secondary | ICD-10-CM | POA: Insufficient documentation

## 2019-04-26 DIAGNOSIS — I081 Rheumatic disorders of both mitral and tricuspid valves: Secondary | ICD-10-CM | POA: Diagnosis not present

## 2019-04-26 DIAGNOSIS — I2510 Atherosclerotic heart disease of native coronary artery without angina pectoris: Secondary | ICD-10-CM | POA: Diagnosis not present

## 2019-04-26 DIAGNOSIS — I110 Hypertensive heart disease with heart failure: Secondary | ICD-10-CM | POA: Insufficient documentation

## 2019-04-26 DIAGNOSIS — I509 Heart failure, unspecified: Secondary | ICD-10-CM | POA: Diagnosis present

## 2019-04-26 NOTE — Progress Notes (Signed)
  Echocardiogram 2D Echocardiogram has been performed.  Chris Guzman 04/26/2019, 11:47 AM

## 2019-05-05 ENCOUNTER — Telehealth (HOSPITAL_COMMUNITY): Payer: Self-pay

## 2019-05-05 NOTE — Telephone Encounter (Signed)
Pts wife returned call, results of echo endorsed. Verbalized understanding. Questions answered.

## 2019-05-05 NOTE — Telephone Encounter (Signed)
-----   Message from Jolaine Artist, MD sent at 05/02/2019  9:15 PM EST ----- EF is normal. Aortic valve is calcified but no stenosis. Aortic root size (4.0cm) is mildly enlarged but unchanged.

## 2019-09-30 IMAGING — CT CT CHEST W/ CM
2 of 3 series · 15 of 36 positions shown, 18 images · IV contrast (Omni 300)
Comparison: Plain film of 05/01/2009.

CLINICAL DATA: Thoracic aortic aneurysm. Follow-up. Aortic root
aneurysm. CABG in 5050.

EXAM:
CT CHEST WITH CONTRAST
TECHNIQUE: Multidetector CT imaging of the chest was performed during
intravenous contrast administration.
CONTRAST:  75 cc of Lsovue-ANN

[Series 3: chest with 2mm st · axial · 0.80mm/px · z∈[+1075,+1351]mm · 12 of 163 slices shown, 15 images]
[im 13/163  mediastinal]
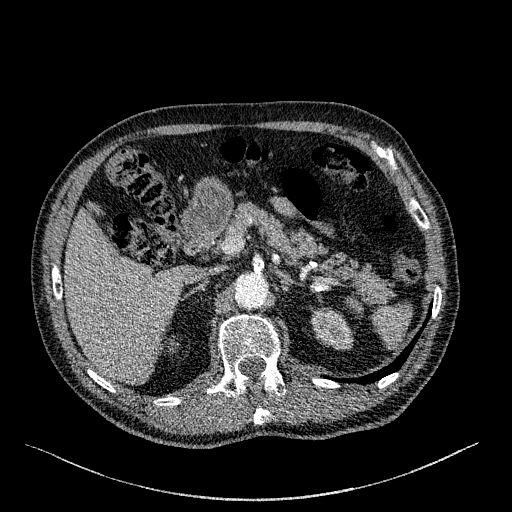
[im 13/163  lung]
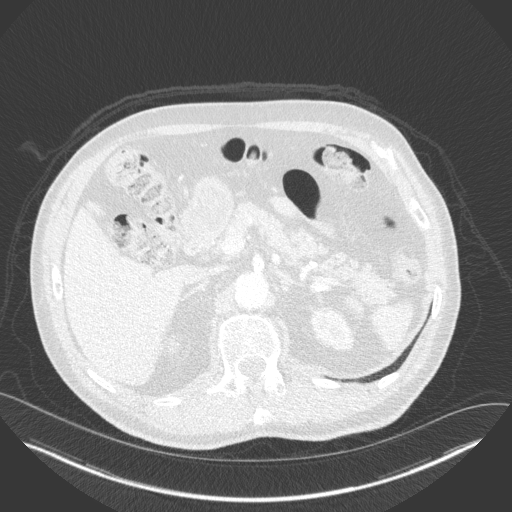
[im 25/163  lung]
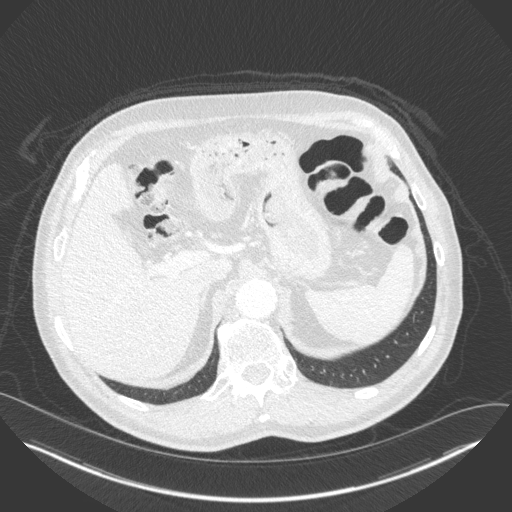
[im 37/163  lung]
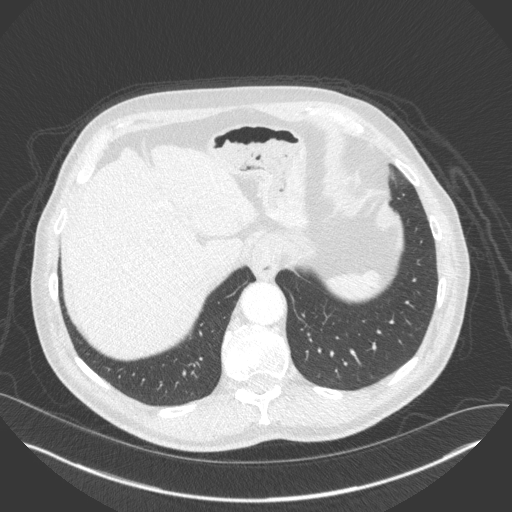
[im 49/163  lung]
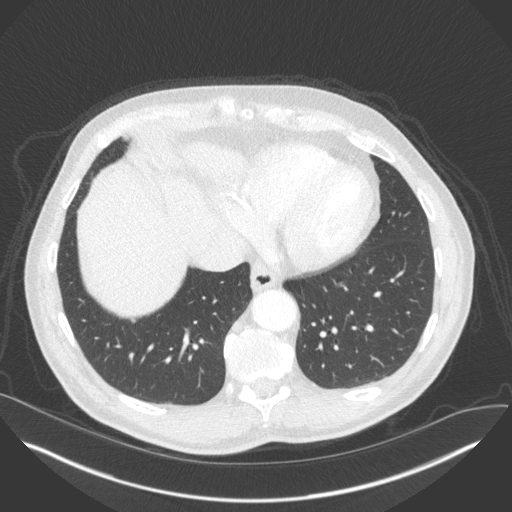
[im 61/163  mediastinal]
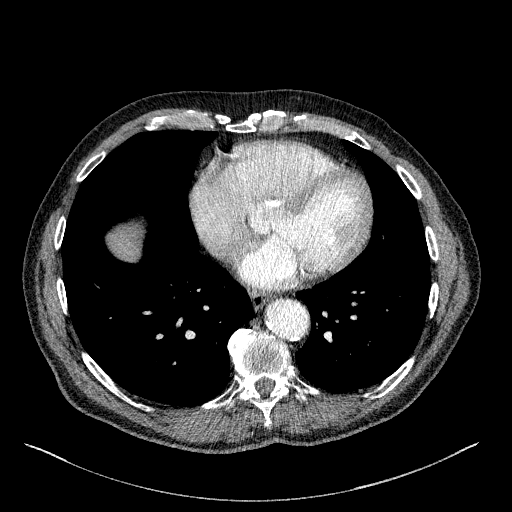
[im 61/163  lung]
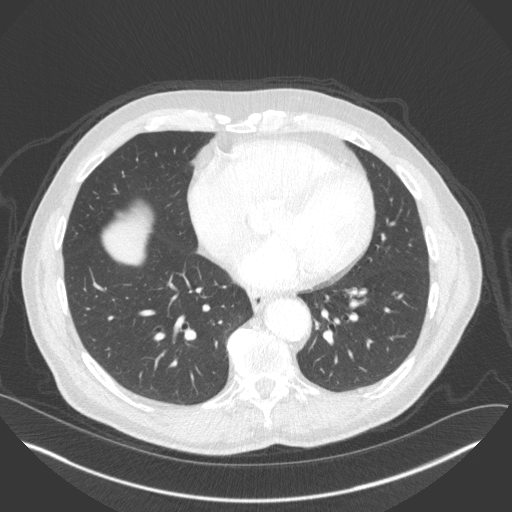
[im 73/163  lung]
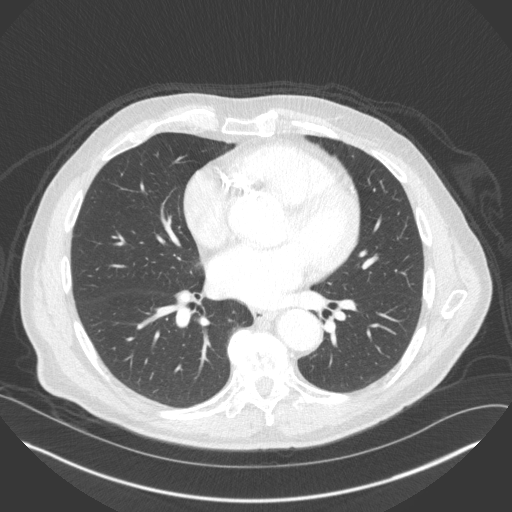
[im 91/163  lung]
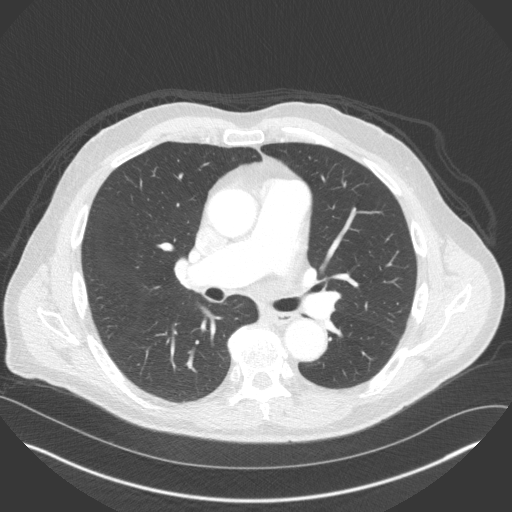
[im 103/163  lung]
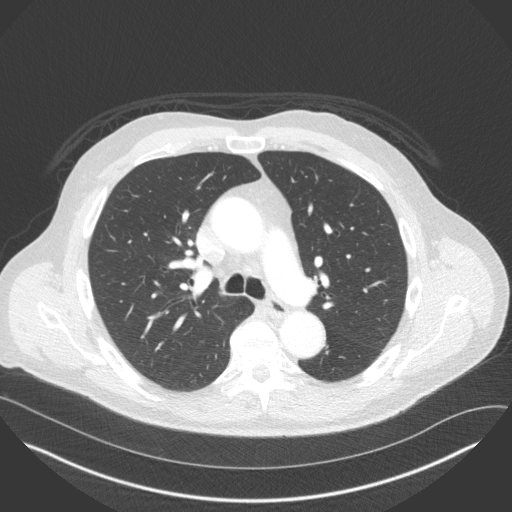
[im 115/163  mediastinal]
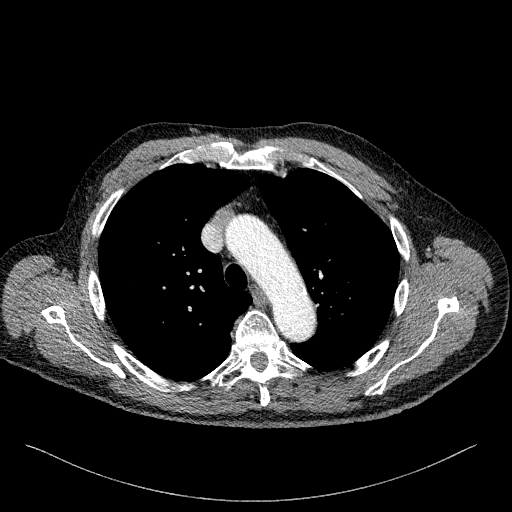
[im 115/163  lung]
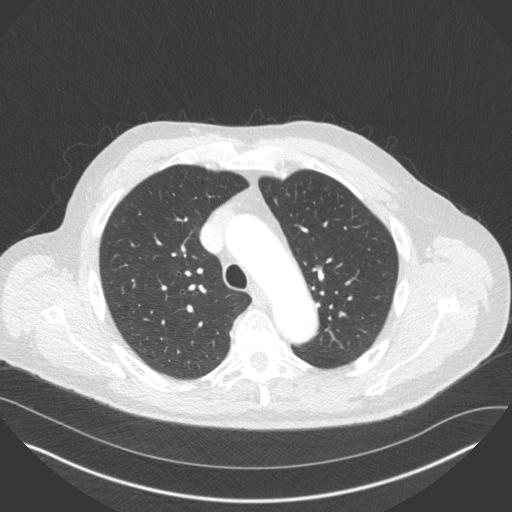
[im 127/163  lung]
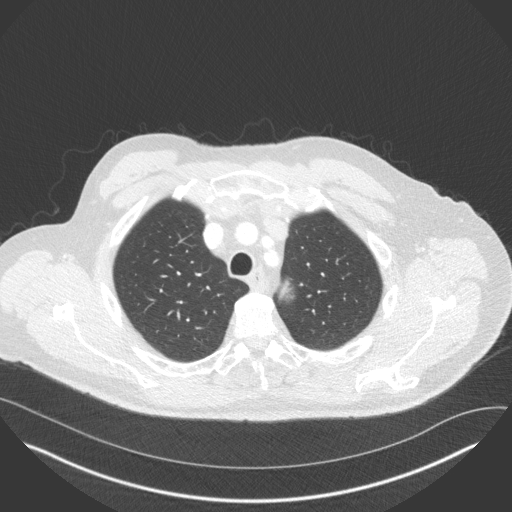
[im 139/163  lung]
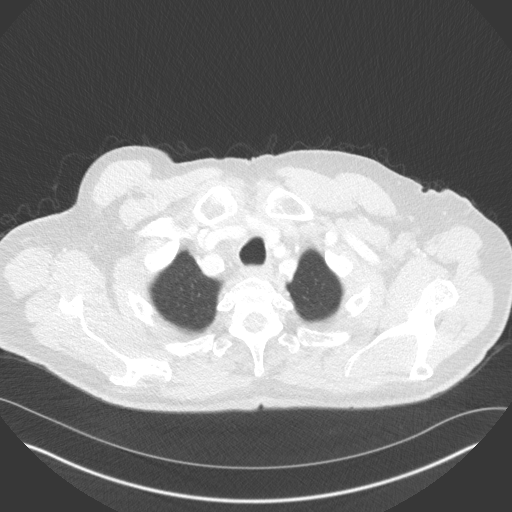
[im 151/163  lung]
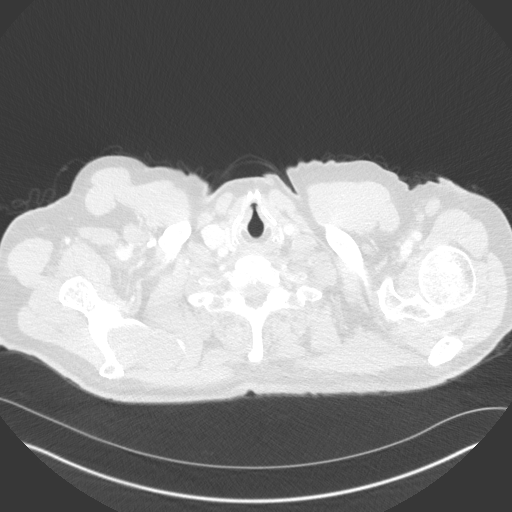

[Series 5: chest with 3mm st cor · coronal · 0.61mm/px · 3 of 101 slices shown]
[im 21/101  lung]
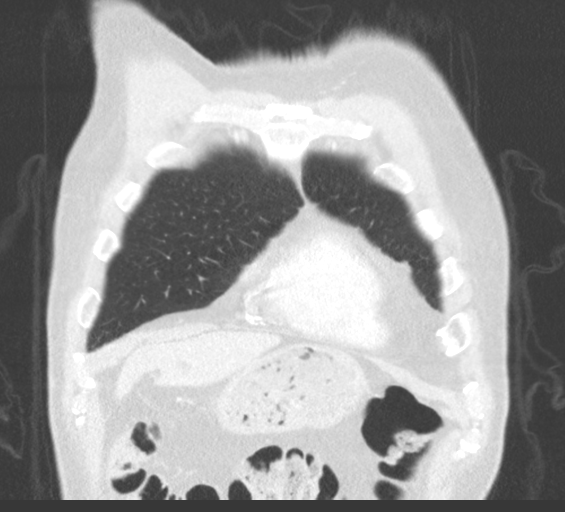
[im 41/101  lung]
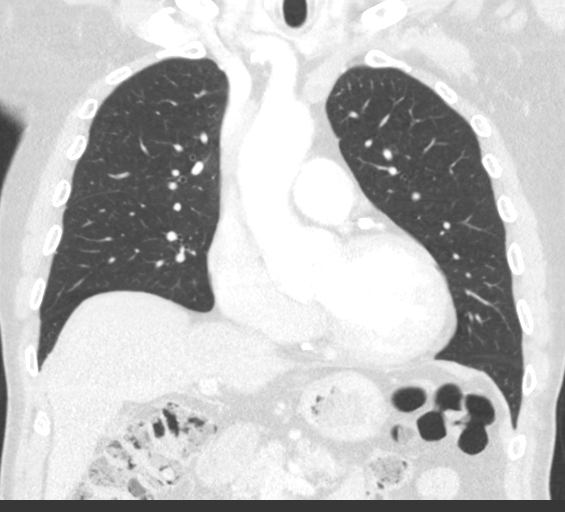
[im 61/101  lung]
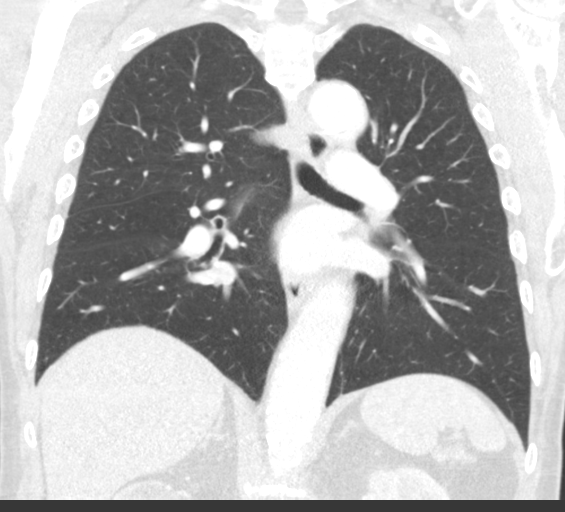

[15 of 36 positions shown; findings below may reference images not displayed]

Clinic note of 06/09/2017.
This describes the aortic root on echocardiography at 4.4 cm.
FINDINGS: Cardiovascular: Aortic and branch vessel atherosclerosis. Ascending
aorta, as measured off coronal reformats, measures 4.3 cm at the
sinuses of Valsalva, 3.6 cm at the Kanny junction, and 3.9 cm
within the mid ascending segment. Series 5. No dilatation of the
transverse or descending aorta. No dissection. Mild cardiomegaly
with multivessel coronary artery atherosclerosis. No pericardial
effusion. Pulmonary artery enlargement, 3.3 cm outflow tract. No
central pulmonary embolism, on this non-dedicated study.

Mediastinum/Nodes: No mediastinal or hilar adenopathy. Tiny hiatal
hernia.

Lungs/Pleura: No pleural fluid. Probable subpleural lymph node of 5
mm within the anterior right lower lobe on image 82/series 4. 2 mm
left lower lobe pulmonary nodule on image 98/series 4.

Upper Abdomen: Left hepatic lobe well-circumscribed low-density
lesions are likely cysts. Normal imaged portions of the spleen,
pancreas, gallbladder, adrenal glands, left kidney. An incompletely
imaged upper pole right renal lesion measures 2.4 cm and is likely a
cyst. Abdominal aortic and branch vessel atherosclerosis.

Musculoskeletal: No acute osseous abnormality.
IMPRESSION: 1. Upper normal ascending aortic size, as detailed above. No
dissection or other acute complication.
2. Coronary artery atherosclerosis. Aortic Atherosclerosis
([id number]).
3. Tiny bilateral pulmonary nodules. No follow-up needed if patient
is low-risk. Non-contrast chest CT can be considered in 12 months if
patient is high-risk. This recommendation follows the consensus
statement: Guidelines for Management of Incidental Pulmonary Nodules
Detected on CT Images: From the [HOSPITAL] 4275; Radiology
4275; [DATE].
4. Pulmonary artery enlargement suggests pulmonary arterial
hypertension.
5.  Tiny hiatal hernia.

## 2019-12-06 ENCOUNTER — Ambulatory Visit (INDEPENDENT_AMBULATORY_CARE_PROVIDER_SITE_OTHER): Payer: Medicare Other | Admitting: Podiatrist

## 2019-12-06 ENCOUNTER — Other Ambulatory Visit: Payer: Self-pay

## 2019-12-06 VITALS — Temp 97.3°F

## 2019-12-06 DIAGNOSIS — M79676 Pain in unspecified toe(s): Secondary | ICD-10-CM

## 2019-12-06 DIAGNOSIS — B351 Tinea unguium: Secondary | ICD-10-CM | POA: Diagnosis not present

## 2019-12-06 DIAGNOSIS — M79609 Pain in unspecified limb: Secondary | ICD-10-CM

## 2019-12-06 DIAGNOSIS — L600 Ingrowing nail: Secondary | ICD-10-CM | POA: Diagnosis not present

## 2019-12-07 ENCOUNTER — Encounter: Payer: Self-pay | Admitting: Podiatrist

## 2019-12-07 NOTE — Progress Notes (Signed)
  Chief Complaint  Patient presents with  . Ingrown Toenail    Bilateral hallux - L lateral border, R medial border. x2 weeks. Pt stated, "It hurts when I wear shoes, and I can even feel it when my bedsheets touch them. Pain varies depending on what kind of shoes I wear. No drainage/bleeding".  . Nail Problem    Bilateral hallux - thick, long, discolored toenails.     HPI: Patient is 86 y.o. male who presents today for the concerns as listed above.    Review of Systems No fevers, chills, nausea, muscle aches, no difficulty breathing, no calf pain, no chest pain or shortness of breath.   Physical Exam  GENERAL APPEARANCE: Alert, conversant. Appropriately groomed. No acute distress.   VASCULAR: Pedal pulses palpable DP and PT bilateral.  Capillary refill time is immediate to all digits,  Proximal to distal cooling it warm to warm.  Digital hair growth is present bilateral   NEUROLOGIC: sensation is intact epicritically and protectively to 5.07 monofilament at 5/5 sites bilateral.  Light touch is intact bilateral, vibratory sensation intact bilateral, achilles tendon reflex is intact bilateral.   MUSCULOSKELETAL: acceptable muscle strength, tone and stability bilateral.  No gross boney pedal deformities noted.  No pain, crepitus or limitation noted with foot and ankle range of motion bilateral.   DERMATOLOGIC: skin is warm, supple, and dry.  No open lesions noted.  No rash, no pre ulcerative lesions. Digital nails are painful and elongated.  Bilateral hallux nails do appear to be thickened and curving in some to the borders. No sign of infection present.    Assessment     ICD-10-CM   1. Ingrowing nail  L60.0   2. Pain due to onychomycosis of nail  B35.1    M79.609      Plan  Debridement of toenails was recommended.  Onychoreduction of symptomatic toenails was performed via nail nipper and power burr without iatrogenic incident.  Patient was instructed on signs and symptoms of  infection and was told to call immediately should any of these arise.  We also discussed a permanent procedure to remove the nail corners of the symptomatic nails should routine debridement not resolve the issue.

## 2020-01-06 DIAGNOSIS — D692 Other nonthrombocytopenic purpura: Secondary | ICD-10-CM | POA: Insufficient documentation

## 2020-03-13 ENCOUNTER — Ambulatory Visit: Payer: Medicare Other | Admitting: Podiatrist

## 2020-04-07 ENCOUNTER — Other Ambulatory Visit: Payer: Self-pay

## 2020-04-07 ENCOUNTER — Ambulatory Visit (HOSPITAL_COMMUNITY)
Admission: RE | Admit: 2020-04-07 | Discharge: 2020-04-07 | Disposition: A | Discharge status: Home / Self Care | Payer: Medicare Other | Source: Ambulatory Visit | Attending: Internal Medicine | Admitting: Internal Medicine

## 2020-04-07 ENCOUNTER — Encounter (HOSPITAL_COMMUNITY): Payer: Self-pay | Admitting: Internal Medicine

## 2020-04-07 VITALS — BP 142/70 | HR 51 | Ht 71.0 in | Wt 199.2 lb

## 2020-04-07 DIAGNOSIS — R0989 Other specified symptoms and signs involving the circulatory and respiratory systems: Secondary | ICD-10-CM | POA: Diagnosis not present

## 2020-04-07 DIAGNOSIS — Z801 Family history of malignant neoplasm of trachea, bronchus and lung: Secondary | ICD-10-CM | POA: Diagnosis not present

## 2020-04-07 DIAGNOSIS — E785 Hyperlipidemia, unspecified: Secondary | ICD-10-CM

## 2020-04-07 DIAGNOSIS — I2510 Atherosclerotic heart disease of native coronary artery without angina pectoris: Secondary | ICD-10-CM

## 2020-04-07 DIAGNOSIS — Z7982 Long term (current) use of aspirin: Secondary | ICD-10-CM | POA: Insufficient documentation

## 2020-04-07 DIAGNOSIS — G4733 Obstructive sleep apnea (adult) (pediatric): Secondary | ICD-10-CM | POA: Diagnosis not present

## 2020-04-07 DIAGNOSIS — I719 Aortic aneurysm of unspecified site, without rupture: Secondary | ICD-10-CM

## 2020-04-07 DIAGNOSIS — R0683 Snoring: Secondary | ICD-10-CM

## 2020-04-07 DIAGNOSIS — R011 Cardiac murmur, unspecified: Secondary | ICD-10-CM | POA: Diagnosis not present

## 2020-04-07 DIAGNOSIS — Z955 Presence of coronary angioplasty implant and graft: Secondary | ICD-10-CM | POA: Insufficient documentation

## 2020-04-07 DIAGNOSIS — Z79899 Other long term (current) drug therapy: Secondary | ICD-10-CM | POA: Diagnosis not present

## 2020-04-07 DIAGNOSIS — Z8249 Family history of ischemic heart disease and other diseases of the circulatory system: Secondary | ICD-10-CM | POA: Insufficient documentation

## 2020-04-07 DIAGNOSIS — R609 Edema, unspecified: Secondary | ICD-10-CM | POA: Diagnosis not present

## 2020-04-07 DIAGNOSIS — I10 Essential (primary) hypertension: Secondary | ICD-10-CM

## 2020-04-07 DIAGNOSIS — Q2543 Congenital aneurysm of aorta: Secondary | ICD-10-CM | POA: Diagnosis not present

## 2020-04-07 DIAGNOSIS — I7121 Aneurysm of the ascending aorta, without rupture: Secondary | ICD-10-CM

## 2020-04-07 NOTE — Addendum Note (Signed)
Encounter addended by: Theresia Bough, CMA on: 04/07/2020 2:50 PM  Actions taken: Order list changed, Diagnosis association updated, Clinical Note Signed

## 2020-04-07 NOTE — Patient Instructions (Signed)
It was great to see you today! No medication changes are needed at this time.  Your provider has recommended that you have a home sleep study.  We have provided you with the equipment in our office today. Please download the app and follow the instructions. YOUR PIN NUMBER IS: [id number]. Once you have completed the test you just dispose of the equipment, the information is automatically uploaded to [place] via blue-tooth technology. If your test is positive for sleep apnea and you need a home CPAP machine you will be contacted by Dr Norris Cross office [facility]) to set this up.  Your physician recommends that you schedule a follow-up appointment in: 12 months with Dr Gala Romney  If you have any questions or concerns before your next appointment please send [place] a message through [place] or call our office at [phone].    TO LEAVE A MESSAGE FOR THE NURSE SELECT OPTION 2, PLEASE LEAVE A MESSAGE INCLUDING: . YOUR NAME . DATE OF BIRTH . CALL BACK NUMBER . REASON FOR CALL**this is important as we prioritize the call backs  YOU WILL RECEIVE A CALL BACK THE SAME DAY AS LONG AS YOU CALL BEFORE 4:00 PM

## 2020-04-07 NOTE — Addendum Note (Signed)
Encounter addended by: Theresia Bough, CMA on: 04/07/2020 3:02 PM  Actions taken: Clinical Note Signed

## 2020-04-07 NOTE — Progress Notes (Addendum)
[facility] Note  Date:  04/07/2020   ID:  Chris Guzman, DOB [birth year], MRN [id number]  Location: Home  Provider location: [address] [place] Type of Visit: Established  PCP:  Pomposini, Rande Brunt, MD  Cardiologist:  No primary care provider on file. Primary HF: DB  Chief Complaint: CAD   History of Present Illness: Chris Guzman is a 86 y.o. male who presents via audio/video conferencing for a telehealth visit today.   Chris Guzman is a 86 y.o. male with h/o HTN, HL and OSA. Also with h/o coronary artery disease s/p DES to LAD and PCI RCA/PTCA diagonal in Sept 2010. In November 2010 had relook cath showing patent stent and small vessel dz, not favorable for PCI.  Carotid dopplers (3/13): Minimal disease.  Echo (10/15): EF 60-65%, mild dilation of aortic root and ascending aorta (4.2/4.0 cm), normal RV size and systolic function, aortic sclerosis without stenosis.   Myoview 6/17:  Nuclear stress EF: 55%.  There was no ST segment deviation noted during stress.  There is a small defect of mild severity present in the basal inferior and mid inferior location. The defect is partially-reversible. This is most consistent with variations in diaphragmatic attenuation.  This is a low risk study.  The left ventricular ejection fraction is normal (55-65%).  Echo 11/17: EF 65-70%. Grade I DD. Aortic sclerosis without stenosis. 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 Mild aortic sclerosis AoRoot stable at 4cm   Here with his wife for routine f/u. Says he feels good. Working in yard. No CP or SOB. No edema.     Past Medical History:  Diagnosis Date  . CAD (coronary artery disease)    aborted inf STEMI on treadmill 9/10; PCI of RCA, DES to LAD  with PTCA  of D2 9/10; relook cath 11/10-patent LAD stent with small branch vessel and dis RCA disease  . Elevated PSA   . HTN (hypertension)    . Hyperlipidemia   . OSA (obstructive sleep apnea)    probable   History reviewed. No pertinent surgical history.   Current Outpatient Medications  Medication Sig Dispense Refill  . amLODipine (NORVASC) 10 MG tablet Take 1 tablet (10 mg total) by mouth daily. 180 tablet 3  . aspirin 81 MG tablet Take 81 mg by mouth daily.      . Cholecalciferol (VITAMIN D) 1000 UNITS capsule Take 1,000 Units by mouth daily.      Marland Kitchen donepezil (ARICEPT) 10 MG tablet donepezil 10 mg tablet  Take 1 tablet every day by oral route.    . fish oil-omega-3 fatty acids 1000 MG capsule Take 1 g by mouth 2 (two) times daily.     . folic acid (FOLVITE) 400 MCG tablet Take 800 mcg by mouth daily.     Marland Kitchen losartan (COZAAR) 50 MG tablet Take 1 tablet (50 mg total) by mouth daily. Takes 1/2 tablet Once Daily 30 tablet 3  . memantine (NAMENDA) 10 MG tablet Take 10 mg by mouth 2 (two) times daily.    . Multiple Vitamins-Minerals (CENTRUM SILVER PO) Take 1 capsule by mouth daily.      Marland Kitchen olmesartan (BENICAR) 20 MG tablet Take 10 mg by mouth daily. Take 1/2 of a 20mg  tablet once a day    . pyridOXINE (VITAMIN B-6) 50 MG tablet Take 25 mg by mouth daily.     . rosuvastatin (CRESTOR)  40 MG tablet Take 1 tablet (40 mg total) by mouth daily. 90 tablet 3  . vitamin B-12 (CYANOCOBALAMIN) 250 MCG tablet Take 250 mcg by mouth daily.      Marland Kitchen donepezil (ARICEPT) 5 MG tablet Take 5 mg by mouth daily. (Patient not taking: Reported on 04/07/2020)    . famotidine (PEPCID) 20 MG tablet Take 20 mg by mouth daily as needed for heartburn.  (Patient not taking: Reported on 04/07/2020)     No current facility-administered medications for this encounter.    Allergies:   Ace inhibitors   Social History:  The patient  reports that he has never smoked. He has never used smokeless tobacco. He reports that he does not drink alcohol and does not use drugs.   Family History:  The patient's family history includes Heart attack in his sister; Heart  attack (age of onset: 81) in his father; Lung cancer in his mother; Pulmonary embolism in his father.   ROS:  Please see the history of present illness.   All other systems are personally reviewed and negative.   Vitals:   04/07/20 1347  BP: (!) 142/70  Pulse: (!) 51  SpO2: 97%    Exam:   General:  Well appearing. No resp difficulty HEENT: normal Neck: supple. no JVD. Carotids 2+ bilat; no bruits. No lymphadenopathy or thryomegaly appreciated. Cor: PMI nondisplaced. Brady regula 2/6 SEM at RUSB Lungs: clear Abdomen: soft, nontender, nondistended. No hepatosplenomegaly. No bruits or masses. Good bowel sounds. Extremities: no cyanosis, clubbing, rash, tr edema with venous stasis changes Neuro: alert & orientedx3, cranial nerves grossly intact. moves all 4 extremities w/o difficulty. Affect pleasant  Sinus brady 51 No ST-T wave abnormalities.    Recent Labs: 04/15/2019: ALT 23; BUN 17; Creatinine, Ser 1.29; Hemoglobin 14.2; Platelets 160; Potassium 3.8; Sodium 139  Personally reviewed   Wt Readings from Last 3 Encounters:  04/07/20 90.4 kg (199 lb 3.2 oz)  04/15/19 90.2 kg (198 lb 12.8 oz)  09/01/18 88.3 kg (194 lb 9.6 oz)      Other studies personally reviewed:   ASSESSMENT AND PLAN:  1) CAD: s/p DES to LAD in 2010, also with PTCA to RCA and diagonal in Sept. 2010.  - Doing well. No s/s angina - He notes that his anginal equivalent is stomach pain. He denies any stomach pain.  - Nuclear study in 11/2015 nonischemic.  - Continue ASA/statin  2) HTN:  -BP ok. Will tolerate SBP up to 150   3) Hyperlipidemia:  - Goal LDL < 70. Followed by PCP. Last LDL 55  4) Snoring:  - Likely OSA will check home sleep study  5) Carotid bruit:  - PCP follows carotid ultrasounds.   6) Aortic area murmur:  -Echo 11/20 with AoV sclerosis without stenosis.   7) Aortic root aneurysm:  - Chest CT 02/2018 with aortic root 4.1 Stable - echo 11/20 AoRoot stable at 4.0cm - Repeat CT  next year   8) LE edema - due to venous stasis - refuses support stockings  Signed, Arvilla Meres, MD  04/07/2020 2:14 PM  [facility] [address] [facility] [place] [address] [phone] (office) [phone] (fax)

## 2020-04-07 NOTE — Addendum Note (Signed)
Encounter addended by: Theresia Bough, CMA on: 04/07/2020 2:41 PM  Actions taken: Clinical Note Signed

## 2020-04-07 NOTE — Addendum Note (Signed)
Encounter addended by: Dolores Patty, MD on: 04/07/2020 2:24 PM  Actions taken: Clinical Note Signed

## 2020-04-07 NOTE — Progress Notes (Signed)
ITAMAR home sleep study given to patient, all instructions explained, and CLOUDPAT registration complete.  

## 2020-04-07 NOTE — Progress Notes (Addendum)
Patient Name: Chris Guzman        DOB: October 05, 2033      Height: 5'11"    Weight: 199.2lb Office Name: [facility]           Today's Date:04/07/2020  STOP BANG RISK ASSESSMENT S (snore) Have you been told that you snore?     YES   T (tired) Are you often tired, fatigued, or sleepy during the day?   YES  O (obstruction) Do you stop breathing, choke, or gasp during sleep? YES   P (pressure) Do you have or are you being treated for high blood pressure? YES   B (BMI) Is your body index greater than 35 kg/m? NO   A (age) Are you 40 years old or older? YES   N (neck) Do you have a neck circumference greater than 16 inches?   YES   G (gender) Are you a male? YES   TOTAL STOP/BANG "YES" ANSWERS 7                                                                       For Office Use Only              Procedure Order Form    YES to 3+ Stop Bang questions OR two clinical symptoms - patient qualifies for WatchPAT (CPT 95800)     Submit: This Form + Patient Face Sheet + Clinical Note via CloudPAT or Fax: [phone]         Clinical Notes: Will consult Sleep Specialist and refer for management of therapy due to patient increased risk of Sleep Apnea. Ordering a sleep study due to the following two clinical symptoms: Excessive daytime sleepiness G47.10 / Loud snoring R06.839  / History of high blood pressure R03.0    I understand that I am proceeding with a home sleep apnea test as ordered by my treating physician. I understand that untreated sleep apnea is a serious cardiovascular risk factor and it is my responsibility to perform the test and seek management for sleep apnea. I will be contacted with the results and be managed for sleep apnea by a local sleep physician. I will be receiving equipment and further instructions from [facility]. I shall promptly ship back the equipment via the included mailing label. I understand my insurance will be billed for the test and as  the patient I am responsible for any insurance related out-of-pocket costs incurred. I have been provided with written instructions and can call for additional video or telephonic instruction, with 24-hour availability of qualified personnel to answer any questions: Patient Help Desk [phone].  Patient Signature ______________________________________________________   Date______________________ Patient Telemedicine Verbal Consent

## 2020-04-24 ENCOUNTER — Other Ambulatory Visit: Payer: Self-pay

## 2020-04-24 ENCOUNTER — Encounter: Payer: Self-pay | Admitting: Podiatrist

## 2020-04-24 ENCOUNTER — Ambulatory Visit (INDEPENDENT_AMBULATORY_CARE_PROVIDER_SITE_OTHER): Payer: Medicare Other | Admitting: Podiatrist

## 2020-04-24 DIAGNOSIS — L600 Ingrowing nail: Secondary | ICD-10-CM

## 2020-04-24 DIAGNOSIS — B351 Tinea unguium: Secondary | ICD-10-CM | POA: Diagnosis not present

## 2020-04-24 DIAGNOSIS — M79609 Pain in unspecified limb: Secondary | ICD-10-CM | POA: Diagnosis not present

## 2020-04-24 NOTE — Progress Notes (Signed)
HPI: Patient is 86 y.o. male who presents today for continued pain in great toenails due to them curving into the skin.  His wife states she is able to trim out the corners when they dig into the skin.  Reports no infection or discomfort at todays visit.      Review of Systems No fevers, chills, nausea, muscle aches, no difficulty breathing, no calf pain, no chest pain or shortness of breath.     Physical Exam   GENERAL APPEARANCE: Alert, conversant. Appropriately groomed. No acute distress.    VASCULAR: Pedal pulses palpable DP and PT bilateral.  Capillary refill time is immediate to all digits,  Proximal to distal cooling it warm to warm.  Digital perfusion adequate.   NEUROLOGIC: sensation is intact epicritically and protectively to 5.07 monofilament at 5/5 sites bilateral.  Light touch is intact bilateral, vibratory sensation intact bilateral, achilles tendon reflex is intact bilateral.    MUSCULOSKELETAL: acceptable muscle strength, tone and stability bilateral.  No gross boney pedal deformities noted.  No pain, crepitus or limitation noted with foot and ankle range of motion bilateral.    DERMATOLOGIC: skin is warm, supple, and dry.  No open lesions noted.  No rash, no pre ulcerative lesions. Digital nails are painful and elongated.  Bilateral hallux nails do appear to be thickened and curving in some to the borders. No sign of infection present.      Assessment        ICD-10-CM    1. Ingrowing nail  L60.0    2. Pain due to onychomycosis of nail  B35.1      M79.609          Plan   Debridement of toenails was recommended.  Onychoreduction of symptomatic toenails was performed via nail nipper and power burr without iatrogenic incident.  Patient was instructed on signs and symptoms of infection and was told to call immediately should any of these arise.  We also discussed a permanent procedure to remove the nail corners of the symptomatic nails should routine debridement not resolve  the issue.  He will call if/when they need continued care.

## 2020-04-24 NOTE — Patient Instructions (Signed)

## 2020-04-27 ENCOUNTER — Ambulatory Visit: Payer: Medicare Other | Admitting: Podiatrist

## 2020-05-09 ENCOUNTER — Telehealth (HOSPITAL_COMMUNITY): Payer: Self-pay | Admitting: *Deleted

## 2020-05-09 NOTE — Telephone Encounter (Signed)
Patients wife called requesting results of home sleep study. No results in system will forward to RN.

## 2020-05-09 NOTE — Telephone Encounter (Signed)
Per Dr Mayford Knife the data from the test was insufficient and pt will need to repeat test. I have reached out to the company regarding the best way to handle this.

## 2020-05-21 ENCOUNTER — Other Ambulatory Visit (HOSPITAL_COMMUNITY): Payer: Self-pay | Admitting: Internal Medicine

## 2020-05-25 ENCOUNTER — Other Ambulatory Visit (HOSPITAL_COMMUNITY): Payer: Self-pay | Admitting: Internal Medicine

## 2020-05-25 ENCOUNTER — Telehealth (HOSPITAL_COMMUNITY): Payer: Self-pay | Admitting: Internal Medicine

## 2020-05-25 NOTE — Telephone Encounter (Signed)
Pt called to f/u with sleep study results, please advise

## 2020-09-13 ENCOUNTER — Ambulatory Visit (HOSPITAL_COMMUNITY)
Admission: RE | Admit: 2020-09-13 | Discharge: 2020-09-13 | Disposition: A | Discharge status: Home / Self Care | Payer: Medicare Other | Source: Ambulatory Visit | Attending: Internal Medicine | Admitting: Internal Medicine

## 2020-09-13 ENCOUNTER — Other Ambulatory Visit: Payer: Self-pay

## 2020-09-13 ENCOUNTER — Encounter (HOSPITAL_COMMUNITY): Payer: Self-pay | Admitting: Internal Medicine

## 2020-09-13 VITALS — BP 124/78 | HR 57 | Wt 209.9 lb

## 2020-09-13 DIAGNOSIS — Z955 Presence of coronary angioplasty implant and graft: Secondary | ICD-10-CM | POA: Insufficient documentation

## 2020-09-13 DIAGNOSIS — I110 Hypertensive heart disease with heart failure: Secondary | ICD-10-CM | POA: Diagnosis not present

## 2020-09-13 DIAGNOSIS — I509 Heart failure, unspecified: Secondary | ICD-10-CM | POA: Diagnosis not present

## 2020-09-13 DIAGNOSIS — I2510 Atherosclerotic heart disease of native coronary artery without angina pectoris: Secondary | ICD-10-CM | POA: Insufficient documentation

## 2020-09-13 DIAGNOSIS — Q2543 Congenital aneurysm of aorta: Secondary | ICD-10-CM | POA: Diagnosis not present

## 2020-09-13 DIAGNOSIS — G4733 Obstructive sleep apnea (adult) (pediatric): Secondary | ICD-10-CM | POA: Diagnosis not present

## 2020-09-13 DIAGNOSIS — Z7982 Long term (current) use of aspirin: Secondary | ICD-10-CM | POA: Diagnosis not present

## 2020-09-13 DIAGNOSIS — I5032 Chronic diastolic (congestive) heart failure: Secondary | ICD-10-CM | POA: Diagnosis not present

## 2020-09-13 DIAGNOSIS — I6529 Occlusion and stenosis of unspecified carotid artery: Secondary | ICD-10-CM | POA: Diagnosis not present

## 2020-09-13 DIAGNOSIS — R011 Cardiac murmur, unspecified: Secondary | ICD-10-CM | POA: Insufficient documentation

## 2020-09-13 DIAGNOSIS — I359 Nonrheumatic aortic valve disorder, unspecified: Secondary | ICD-10-CM

## 2020-09-13 DIAGNOSIS — Z79899 Other long term (current) drug therapy: Secondary | ICD-10-CM | POA: Diagnosis not present

## 2020-09-13 DIAGNOSIS — I10 Essential (primary) hypertension: Secondary | ICD-10-CM

## 2020-09-13 DIAGNOSIS — E785 Hyperlipidemia, unspecified: Secondary | ICD-10-CM | POA: Insufficient documentation

## 2020-09-13 DIAGNOSIS — R0989 Other specified symptoms and signs involving the circulatory and respiratory systems: Secondary | ICD-10-CM

## 2020-09-13 DIAGNOSIS — I878 Other specified disorders of veins: Secondary | ICD-10-CM | POA: Diagnosis not present

## 2020-09-13 DIAGNOSIS — Z8249 Family history of ischemic heart disease and other diseases of the circulatory system: Secondary | ICD-10-CM | POA: Insufficient documentation

## 2020-09-13 HISTORY — DX: Heart failure, unspecified: I50.9

## 2020-09-13 NOTE — Progress Notes (Signed)
[facility] Note  Date:  09/13/2020   ID:  Chris Guzman, DOB 08-07-1933, MRN [id number]  Location: Home  Provider location: [address] [place] Type of Visit: Established  PCP:  Guzman, Chris Brunt, MD  Cardiologist:  No primary care provider on file. Primary HF: DB  Chief Complaint: CAD   History of Present Illness:   Chris Guzman is a 87 y.o. male with h/o HTN, HL and OSA. Also with h/o coronary artery disease s/p DES to LAD and PCI RCA/PTCA diagonal in Sept 2010. In November 2010 had relook cath showing patent stent and small vessel dz, not favorable for PCI.  Carotid dopplers (3/13): Minimal disease.  Echo (10/15): EF 60-65%, mild dilation of aortic root and ascending aorta (4.2/4.0 cm), normal RV size and systolic function, aortic sclerosis without stenosis.   Myoview 6/17:  Nuclear stress EF: 55%.  There was no ST segment deviation noted during stress.  There is a small defect of mild severity present in the basal inferior and mid inferior location. The defect is partially-reversible. This is most consistent with variations in diaphragmatic attenuation.  This is a low risk study.  The left ventricular ejection fraction is normal (55-65%).  Echo 11/17: EF 65-70%. Grade I DD. Aortic sclerosis without stenosis. 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 Mild aortic sclerosis AoRoot stable at 4cm   Here with his wife and daughter for routine f/u. Says he was having some dizziness lately. Went for brain MRI and no acute process. Apparently did have tight RICA. No amaurosis. Dizziness improved. No focal weakness. No CP or SOB. Not very active. Can go to mailbox without much problem.    Past Medical History:  Diagnosis Date  . CAD (coronary artery disease)    aborted inf STEMI on treadmill 9/10; PCI of RCA, DES to LAD  with PTCA  of D2 9/10; relook cath 11/10-patent LAD stent  with small branch vessel and dis RCA disease  . CHF (congestive heart failure) (HCC)   . Elevated PSA   . HTN (hypertension)   . Hyperlipidemia   . OSA (obstructive sleep apnea)    probable   No past surgical history on file.   Current Outpatient Medications  Medication Sig Dispense Refill  . amLODipine (NORVASC) 10 MG tablet Take 1 tablet (10 mg total) by mouth daily. 180 tablet 3  . ammonium lactate (AMLACTIN) 12 % cream ammonium lactate 12 % topical cream  APPLY TO DRY SKIN ON LEGS ARMS AND BACK ONCE TO TWICE DAILY AFTER SHOWERS (AVOID FACE OPEN CUTS AND GROIN)    . aspirin 81 MG tablet Take 81 mg by mouth daily.    . Cholecalciferol (VITAMIN D) 1000 UNITS capsule Take 1,000 Units by mouth daily.    Marland Kitchen donepezil (ARICEPT) 10 MG tablet donepezil 10 mg tablet  Take 1 tablet every day by oral route.    . donepezil (ARICEPT) 5 MG tablet Take 5 mg by mouth daily.    . fish oil-omega-3 fatty acids 1000 MG capsule Take 1 g by mouth 2 (two) times daily.    . folic acid (FOLVITE) 400 MCG tablet Take 800 mcg by mouth daily.    . furosemide (LASIX) 20 MG tablet Take 20 mg by mouth daily as needed.    . hydrocortisone 2.5 % ointment hydrocortisone 2.5 % topical ointment    . losartan (COZAAR) 50 MG tablet  Take 1 tablet (50 mg total) by mouth daily. Takes 1/2 tablet Once Daily 30 tablet 3  . memantine (NAMENDA) 10 MG tablet Take 10 mg by mouth 2 (two) times daily.    . Multiple Vitamins-Minerals (CENTRUM SILVER PO) Take 1 capsule by mouth daily.    Marland Kitchen olmesartan (BENICAR) 20 MG tablet Take 10 mg by mouth daily. Take 1/2 of a 20mg  tablet once a day    . permethrin (ELIMITE) 5 % cream Apply 1 application topically once.    . pyridOXINE (VITAMIN B-6) 50 MG tablet Take 25 mg by mouth daily.    . rosuvastatin (CRESTOR) 40 MG tablet Take 1 tablet by mouth once daily 90 tablet 3  . triamcinolone cream (KENALOG) 0.1 % SMARTSIG:1 Application Topical 2-3 Times Daily    . vitamin B-12 (CYANOCOBALAMIN) 250  MCG tablet Take 250 mcg by mouth daily.     No current facility-administered medications for this encounter.    Allergies:   Ace inhibitors   Social History:  The patient  reports that he has never smoked. He has never used smokeless tobacco. He reports that he does not drink alcohol and does not use drugs.   Family History:  The patient's family history includes Heart attack in his sister; Heart attack (age of onset: 40) in his father; Lung cancer in his mother; Pulmonary embolism in his father.   ROS:  Please see the history of present illness.   All other systems are personally reviewed and negative.   Vitals:   09/13/20 1134  BP: 124/78  Pulse: (!) 57  SpO2: 97%    Exam:   General:  Well appearing. No resp difficulty HEENT: normal Neck: supple. no JVD. Carotids 2+ bilat; R bruit. No lymphadenopathy or thryomegaly appreciated. Cor: PMI nondisplaced. Regular rate & rhythm. Soft AS murmur Lungs: clear Abdomen: soft, nontender, nondistended. No hepatosplenomegaly. No bruits or masses. Good bowel sounds. Extremities: no cyanosis, clubbing, rash, 1+ R>L edema Neuro: alert & orientedx3, cranial nerves grossly intact. moves all 4 extremities w/o difficulty. Affect pleasant   Recent Labs: No results found for requested labs within last 8760 hours.  Personally reviewed   Wt Readings from Last 3 Encounters:  09/13/20 95.2 kg (209 lb 14.4 oz)  04/07/20 90.4 kg (199 lb 3.2 oz)  04/15/19 90.2 kg (198 lb 12.8 oz)     ASSESSMENT AND PLAN:  1) CAD: s/p DES to LAD in 2010, also with PTCA to RCA and diagonal in Sept. 2010.  - Doing well. No s/s angina - He notes that his anginal equivalent is stomach pain. No s/s angina currently - Nuclear study in 11/2015 nonischemic.  - Continue ASA/statin  2) HTN:  - Blood pressure well controlled. Continue current regimen.  3) Hyperlipidemia:  - Goal LDL < 70. Followed by PCP. Last LDL 55  4) Carotid stenosis - Apparently with high-grade  RICA stenosis - will refer to Dr. 12/2015 with [facility]  5) Aortic area murmur:  -Echo 11/20 with AoV sclerosis without stenosis.  - will repeat   7) Aortic root aneurysm:  - Chest CT 02/2018 with aortic root 4.1 Stable - echo 11/20 AoRoot stable at 4.0cm - will repeat echo to reassess  8) LE edema - due to venous stasis. Unchanged  - refuses support stockings  Signed, 12/20, MD  09/13/2020 11:08 PM  [facility] [address] [facility] [place] [phone] (office) [phone] (fax)

## 2020-09-13 NOTE — Patient Instructions (Signed)
No Labs done today.   No medication changes were made. Please continue all current medications as prescribed.  Your physician recommends that you schedule a follow-up appointment soon for an echo and in 1 year. Please contact our office in February 2023 for a March 2023 appointment.   You have been referred to Dr. Myra Gianotti at [facility]. They will contact you to schedule an appointment.  Your physician has requested that you have an echocardiogram. Echocardiography is a painless test that uses sound waves to create images of your heart. It provides your doctor with information about the size and shape of your heart and how well your heart's chambers and valves are working. This procedure takes approximately one hour. There are no restrictions for this procedure.  If you have any questions or concerns before your next appointment please send [place] a message through [facility] or call our office at [phone].    TO LEAVE A MESSAGE FOR THE NURSE SELECT OPTION 2, PLEASE LEAVE A MESSAGE INCLUDING: . YOUR NAME . DATE OF BIRTH . CALL BACK NUMBER . REASON FOR CALL**this is important as we prioritize the call backs  YOU WILL RECEIVE A CALL BACK THE SAME DAY AS LONG AS YOU CALL BEFORE 4:00 PM   Do the following things EVERYDAY: 1) Weigh yourself in the morning before breakfast. Write it down and keep it in a log. 2) Take your medicines as prescribed 3) Eat low salt foods--Limit salt (sodium) to 2000 mg per day.  4) Stay as active as you can everyday 5) Limit all fluids for the day to less than 2 liters   At the [facility], you and your health needs are our priority. As part of our continuing mission to provide you with exceptional heart care, we have created designated Provider Care Teams. These Care Teams include your primary Cardiologist (physician) and Advanced Practice Providers (APPs- Physician Assistants and Nurse Practitioners) who all work together to  provide you with the care you need, when you need it.   You may see any of the following providers on your designated Care Team at your next follow up: Marland Kitchen Dr Arvilla Meres . Dr Marca Ancona . Tonye Becket, NP . Robbie Lis, PA . Karle Plumber, PharmD   Please be sure to bring in all your medications bottles to every appointment.

## 2020-09-19 ENCOUNTER — Other Ambulatory Visit: Payer: Self-pay

## 2020-09-19 ENCOUNTER — Ambulatory Visit (HOSPITAL_COMMUNITY)
Admission: RE | Admit: 2020-09-19 | Discharge: 2020-09-19 | Disposition: A | Discharge status: Home / Self Care | Payer: Medicare Other | Source: Ambulatory Visit | Attending: Internal Medicine | Admitting: Internal Medicine

## 2020-09-19 DIAGNOSIS — I359 Nonrheumatic aortic valve disorder, unspecified: Secondary | ICD-10-CM | POA: Diagnosis not present

## 2020-09-19 DIAGNOSIS — I77810 Thoracic aortic ectasia: Secondary | ICD-10-CM | POA: Insufficient documentation

## 2020-09-19 LAB — ECHOCARDIOGRAM COMPLETE
AR max vel: 2.63 cm2
AV Area VTI: 2.56 cm2
AV Area mean vel: 2.46 cm2
AV Mean grad: 8 mmHg
AV Peak grad: 16.8 mmHg
Ao pk vel: 2.05 m/s
Area-P 1/2: 2.82 cm2
S' Lateral: 3.2 cm
Single Plane A4C EF: 68.2 %

## 2020-09-19 NOTE — Progress Notes (Signed)
  Echocardiogram 2D Echocardiogram has been performed.  Roosvelt Maser F 09/19/2020, 11:04 AM

## 2020-09-25 ENCOUNTER — Telehealth (HOSPITAL_COMMUNITY): Payer: Self-pay | Admitting: *Deleted

## 2020-09-25 NOTE — Telephone Encounter (Signed)
pts wife left vm requesting results of echo. I called back no answer/left vm requesting return call.

## 2020-09-27 ENCOUNTER — Other Ambulatory Visit: Payer: Self-pay

## 2020-09-27 DIAGNOSIS — I6521 Occlusion and stenosis of right carotid artery: Secondary | ICD-10-CM

## 2020-10-16 ENCOUNTER — Ambulatory Visit (INDEPENDENT_AMBULATORY_CARE_PROVIDER_SITE_OTHER): Payer: Medicare Other | Admitting: Surgery

## 2020-10-16 ENCOUNTER — Other Ambulatory Visit: Payer: Self-pay

## 2020-10-16 ENCOUNTER — Ambulatory Visit (HOSPITAL_COMMUNITY)
Admission: RE | Admit: 2020-10-16 | Discharge: 2020-10-16 | Disposition: A | Discharge status: Home / Self Care | Payer: Medicare Other | Source: Ambulatory Visit | Attending: Surgery | Admitting: Surgery

## 2020-10-16 ENCOUNTER — Encounter: Payer: Self-pay | Admitting: Surgery

## 2020-10-16 VITALS — BP 144/78 | HR 56 | Temp 98.6°F | Resp 20 | Ht 71.0 in | Wt 212.5 lb

## 2020-10-16 DIAGNOSIS — I6521 Occlusion and stenosis of right carotid artery: Secondary | ICD-10-CM

## 2020-10-16 DIAGNOSIS — M7989 Other specified soft tissue disorders: Secondary | ICD-10-CM

## 2020-10-16 NOTE — Progress Notes (Signed)
Vascular and Vein Specialist of [facility]  Patient name: Chris Guzman MRN: [id number] DOB: 1933/08/18 Sex: male   REQUESTING PROVIDER:    Dr. Gala Romney   REASON FOR CONSULT:    carotid  HISTORY OF PRESENT ILLNESS:   Chris Guzman is a 87 y.o. male who is referred for evaluation of carotid stenosis.  Earlier this year the patient was having issues with dizziness.  He underwent a MRI which suggested a high-grade right ICA stenosis.  He is here for further evaluation.  His dizziness episodes have resolved.  They did occur mostly with standing.  He denies any localized neurologic symptoms such as numbness or weakness in either extremity, slurred speech, or amaurosis fugax.  Patient has history of coronary artery disease.  He is status post PCI in 2010.  He has a history of congestive heart failure with diastolic dysfunction.  He is medically managed for hypertension.  He is a non-smoker.  He is on a statin for hypercholesterolemia.  He does complain of worsening edema bilaterally, right greater than left  PAST MEDICAL HISTORY    Past Medical History:  Diagnosis Date  . CAD (coronary artery disease)    aborted inf STEMI on treadmill 9/10; PCI of RCA, DES to LAD  with PTCA  of D2 9/10; relook cath 11/10-patent LAD stent with small branch vessel and dis RCA disease  . Carotid artery occlusion   . CHF (congestive heart failure) (HCC)   . Elevated PSA   . HTN (hypertension)   . Hyperlipidemia   . OSA (obstructive sleep apnea)    probable     FAMILY HISTORY   Family History  Problem Relation Age of Onset  . Pulmonary embolism Father   . Heart attack Father 52  . Lung cancer Mother   . Heart attack Sister     SOCIAL HISTORY:   Social History   Socioeconomic History  . Marital status: Married    Spouse name: Not on file  . Number of children: Not on file  . Years of education: Not on file  . Highest education level: Not on file   Occupational History  . Not on file  Tobacco Use  . Smoking status: Never Smoker  . Smokeless tobacco: Never Used  Vaping Use  . Vaping Use: Never used  Substance and Sexual Activity  . Alcohol use: No  . Drug use: No  . Sexual activity: Not on file  Other Topics Concern  . Not on file  Social History Narrative  . Not on file   Social Determinants of Health   Financial Resource Strain: Not on file  Food Insecurity: Not on file  Transportation Needs: Not on file  Physical Activity: Not on file  Stress: Not on file  Social Connections: Not on file  Intimate Partner Violence: Not on file    ALLERGIES:    Allergies  Allergen Reactions  . Ace Inhibitors Cough    CURRENT MEDICATIONS:    Current Outpatient Medications  Medication Sig Dispense Refill  . ammonium lactate (AMLACTIN) 12 % cream ammonium lactate 12 % topical cream  APPLY TO DRY SKIN ON LEGS ARMS AND BACK ONCE TO TWICE DAILY AFTER SHOWERS (AVOID FACE OPEN CUTS AND GROIN)    . aspirin 81 MG tablet Take 81 mg by mouth daily.    . Cholecalciferol (VITAMIN D) 1000 UNITS capsule Take 1,000 Units by mouth daily.    Marland Kitchen donepezil (ARICEPT) 10 MG tablet donepezil 10 mg tablet  Take 1  tablet every day by oral route.    . donepezil (ARICEPT) 5 MG tablet Take 5 mg by mouth daily.    . fish oil-omega-3 fatty acids 1000 MG capsule Take 1 g by mouth 2 (two) times daily.    . folic acid (FOLVITE) 400 MCG tablet Take 800 mcg by mouth daily.    . furosemide (LASIX) 20 MG tablet Take 20 mg by mouth daily as needed.    . hydrocortisone 2.5 % ointment hydrocortisone 2.5 % topical ointment    . losartan (COZAAR) 50 MG tablet Take 1 tablet (50 mg total) by mouth daily. Takes 1/2 tablet Once Daily 30 tablet 3  . memantine (NAMENDA) 10 MG tablet Take 10 mg by mouth 2 (two) times daily.    . Multiple Vitamins-Minerals (CENTRUM SILVER PO) Take 1 capsule by mouth daily.    Marland Kitchen olmesartan (BENICAR) 20 MG tablet Take 10 mg by mouth daily.  Take 1/2 of a 20mg  tablet once a day    . permethrin (ELIMITE) 5 % cream Apply 1 application topically once.    . pyridOXINE (VITAMIN B-6) 50 MG tablet Take 25 mg by mouth daily.    . rosuvastatin (CRESTOR) 40 MG tablet Take 1 tablet by mouth once daily 90 tablet 3  . triamcinolone cream (KENALOG) 0.1 % SMARTSIG:1 Application Topical 2-3 Times Daily    . vitamin B-12 (CYANOCOBALAMIN) 250 MCG tablet Take 250 mcg by mouth daily.    amLODipine (NORVASC) 10 MG tablet Take 1 tablet (10 mg total) by mouth daily. 180 tablet 3   No current facility-administered medications for this visit.    REVIEW OF SYSTEMS:   [X]  denotes positive finding, [ ]  denotes negative finding Cardiac  Comments:  Chest pain or chest pressure:    Shortness of breath upon exertion:    Short of breath when lying flat:    Irregular heart rhythm:        Vascular    Pain in calf, thigh, or hip brought on by ambulation:    Pain in feet at night that wakes you up from your sleep:     Blood clot in your veins:    Leg swelling:  x       Pulmonary    Oxygen at home:    Productive cough:     Wheezing:         Neurologic    Sudden weakness in arms or legs:     Sudden numbness in arms or legs:     Sudden onset of difficulty speaking or slurred speech:    Temporary loss of vision in one eye:     Problems with dizziness:         Gastrointestinal    Blood in stool:      Vomited blood:         Genitourinary    Burning when urinating:     Blood in urine:        Psychiatric    Major depression:         Hematologic    Bleeding problems:    Problems with blood clotting too easily:        Skin    Rashes or ulcers:        Constitutional    Fever or chills:     PHYSICAL EXAM:   Vitals:   10/16/20 1010 10/16/20 1013  BP: 138/79 (!) 144/78  Pulse: (!) 56   Resp: 20   Temp: 98.6 F (37  C)   SpO2: 95%   Weight: 212 lb 8 oz (96.4 kg)   Height: 5\' 11"  (1.803 m)     GENERAL: The patient is a  well-nourished male, in no acute distress. The vital signs are documented above. CARDIAC: There is a regular rate and rhythm.  VASCULAR: Palpable right dorsalis pedis pulse, I could not palpate the left.  2+ bilateral right greater than left pitting edema with bilateral hyperpigmentation PULMONARY: Nonlabored respirations ABDOMEN: Soft and non-tender.  No pulsatile mass MUSCULOSKELETAL: There are no major deformities or cyanosis. NEUROLOGIC: No focal weakness or paresthesias are detected. SKIN: There are no ulcers or rashes noted. PSYCHIATRIC: The patient has a normal affect.  STUDIES:   I have reviewed his carotid ultrasound with the following findings: Right Carotid: Velocities in the right ICA are consistent with a 60-79%         stenosis.   Left Carotid: Velocities in the left ICA are consistent with a 1-39%  stenosis.   ASSESSMENT and PLAN   Carotid: Ultrasound today shows 60-79% right-sided stenosis and no significant left-sided stenosis.  I believe that he is asymptomatic.  I do not feel that his dizziness is related to his carotid disease.  We discussed that no surgical intervention is recommended at this time.  He should continue his aspirin and statin therapy.  We discussed considering intervention if the stenosis becomes greater than 80%, or if he develops symptoms.  I will have him follow-up in 6 months with repeat carotid duplex  Leg swelling: This does not appear to be venous insufficiency but rather either from heart failure or simply lymphedema.  I will get a venous reflux study when he returns.  He was fitted for compression stockings today and encouraged to wear them as much as possible and keep his legs elevated.  I will see him back in 6 months   , MD, FACS Vascular and Vein Specialists of [facility] [phone] Pager [phone]

## 2020-10-25 ENCOUNTER — Other Ambulatory Visit: Payer: Self-pay

## 2020-10-25 DIAGNOSIS — M7989 Other specified soft tissue disorders: Secondary | ICD-10-CM

## 2020-10-25 DIAGNOSIS — I6521 Occlusion and stenosis of right carotid artery: Secondary | ICD-10-CM

## 2020-10-27 DIAGNOSIS — M7989 Other specified soft tissue disorders: Secondary | ICD-10-CM

## 2021-02-06 DIAGNOSIS — F0280 Dementia in other diseases classified elsewhere without behavioral disturbance: Secondary | ICD-10-CM | POA: Insufficient documentation

## 2021-02-06 DIAGNOSIS — R262 Difficulty in walking, not elsewhere classified: Secondary | ICD-10-CM | POA: Insufficient documentation

## 2021-02-06 DIAGNOSIS — R5383 Other fatigue: Secondary | ICD-10-CM | POA: Insufficient documentation

## 2021-03-16 ENCOUNTER — Ambulatory Visit: Payer: Medicare Other | Admitting: Podiatrist

## 2021-03-27 ENCOUNTER — Other Ambulatory Visit: Payer: Self-pay | Admitting: *Deleted

## 2021-03-27 DIAGNOSIS — M7989 Other specified soft tissue disorders: Secondary | ICD-10-CM

## 2021-04-05 ENCOUNTER — Other Ambulatory Visit: Payer: Self-pay

## 2021-04-05 ENCOUNTER — Encounter: Payer: Self-pay | Admitting: Podiatrist

## 2021-04-05 ENCOUNTER — Ambulatory Visit (INDEPENDENT_AMBULATORY_CARE_PROVIDER_SITE_OTHER): Payer: Medicare Other | Admitting: Podiatrist

## 2021-04-05 DIAGNOSIS — M79609 Pain in unspecified limb: Secondary | ICD-10-CM | POA: Diagnosis not present

## 2021-04-05 DIAGNOSIS — L600 Ingrowing nail: Secondary | ICD-10-CM

## 2021-04-05 DIAGNOSIS — B351 Tinea unguium: Secondary | ICD-10-CM

## 2021-04-05 NOTE — Patient Instructions (Signed)

## 2021-04-05 NOTE — Progress Notes (Signed)
HPI: Patient is 87 y.o. male who presents today for continued pain in great toenails due to them curving into the skin.  His wife Chris Guzman states she is able to trim out the corners when they dig into the skin.  He relates pain in the corners when wearing shoes.  Relates pain with pressure on the corners of the great toenails.      Review of Systems No fevers, chills, nausea, muscle aches, no difficulty breathing, no calf pain, no chest pain or shortness of breath.     Physical Exam   GENERAL APPEARANCE: Alert, conversant. Appropriately groomed. No acute distress.    VASCULAR: Pedal pulses palpable DP and PT bilateral.  Capillary refill time is immediate to all digits,  Proximal to distal cooling it warm to warm.  Digital perfusion adequate.   NEUROLOGIC: sensation is intact epicritically and protectively to 5.07 monofilament at 5/5 sites bilateral.  Light touch is intact bilateral, vibratory sensation intact bilateral, achilles tendon reflex is intact bilateral.    MUSCULOSKELETAL: acceptable muscle strength, tone and stability bilateral.  No gross boney pedal deformities noted.  No pain, crepitus or limitation noted with foot and ankle range of motion bilateral.    DERMATOLOGIC: skin is warm, supple, and dry.  No open lesions noted.  No rash, no pre ulcerative lesions. Digital nails are painful and elongated.  Bilateral hallux nails do appear to be thickened and curving in some to the borders. No sign of infection present.      Assessment    1. Ingrowing nail   2. Pain due to onychomycosis of nail        Plan   Debridement of bilateral great toenails was recommended.  Onychoreduction of symptomatic toenails was performed via nail nipper and power burr without iatrogenic incident.  Patient was instructed on signs and symptoms of infection and was told to call immediately should any of these arise.  We also briefly discussed a permanent procedure to remove the nail corners of the  symptomatic nails should routine debridement not resolve the issue.  He will call if/when they need continued care.

## 2021-04-09 ENCOUNTER — Ambulatory Visit: Payer: Medicare Other | Admitting: Surgery

## 2021-04-09 ENCOUNTER — Encounter (HOSPITAL_COMMUNITY): Payer: Medicare Other

## 2021-04-30 ENCOUNTER — Ambulatory Visit (INDEPENDENT_AMBULATORY_CARE_PROVIDER_SITE_OTHER): Payer: Medicare Other | Admitting: Surgery

## 2021-04-30 ENCOUNTER — Other Ambulatory Visit: Payer: Self-pay

## 2021-04-30 ENCOUNTER — Encounter: Payer: Self-pay | Admitting: Surgery

## 2021-04-30 ENCOUNTER — Ambulatory Visit (INDEPENDENT_AMBULATORY_CARE_PROVIDER_SITE_OTHER)
Admission: RE | Admit: 2021-04-30 | Discharge: 2021-04-30 | Disposition: A | Discharge status: Home / Self Care | Payer: Medicare Other | Source: Ambulatory Visit | Attending: Surgery | Admitting: Surgery

## 2021-04-30 ENCOUNTER — Ambulatory Visit (HOSPITAL_COMMUNITY)
Admission: RE | Admit: 2021-04-30 | Discharge: 2021-04-30 | Disposition: A | Discharge status: Home / Self Care | Payer: Medicare Other | Source: Ambulatory Visit | Attending: Surgery | Admitting: Surgery

## 2021-04-30 VITALS — BP 127/73 | HR 61 | Temp 98.6°F | Resp 20 | Ht 71.0 in | Wt 212.0 lb

## 2021-04-30 DIAGNOSIS — M7989 Other specified soft tissue disorders: Secondary | ICD-10-CM

## 2021-04-30 DIAGNOSIS — I6521 Occlusion and stenosis of right carotid artery: Secondary | ICD-10-CM | POA: Insufficient documentation

## 2021-04-30 NOTE — Progress Notes (Signed)
Vascular and Vein Specialist of [facility]  Patient name: Chris Guzman MRN: [id number] DOB: [birth year] Sex: male   REASON FOR VISIT:    Follow up  HISOTRY OF PRESENT ILLNESS:    Sheila Gervasi is a 87 y.o. male who I saw in April 2022 for evaluation of carotid stenosis.  Earlier this year the patient was having issues with dizziness.  He underwent a MRI which suggested a high-grade right ICA stenosis.   His dizziness episodes have resolved.  They did occur mostly with standing.  He denies any localized neurologic symptoms such as numbness or weakness in either extremity, slurred speech, or amaurosis fugax.  His carotid duplex showed 60 to 79% right-sided stenosis and minimal left stenosis.  I felt that this was not causing his dizziness and so no intervention was recommended.  He is also having issues with leg swelling.  I placed him in compression socks and he was scheduled to get a venous duplex today.  He feels that his swelling is a little better   Patient has history of coronary artery disease.  He is status post PCI in 2010.  He has a history of congestive heart failure with diastolic dysfunction.  He is medically managed for hypertension.  He is a non-smoker.  He is on a statin for hypercholesterolemia.  He does complain of worsening edema bilaterally, right greater than left   PAST MEDICAL HISTORY:   Past Medical History:  Diagnosis Date   CAD (coronary artery disease)    aborted inf STEMI on treadmill 9/10; PCI of RCA, DES to LAD  with PTCA  of D2 9/10; relook cath 11/10-patent LAD stent with small branch vessel and dis RCA disease   Carotid artery occlusion    CHF (congestive heart failure) (HCC)    Elevated PSA    HTN (hypertension)    Hyperlipidemia    OSA (obstructive sleep apnea)    probable     FAMILY HISTORY:   Family History  Problem Relation Age of Onset   Pulmonary embolism Father    Heart attack Father 21   Lung  cancer Mother    Heart attack Sister     SOCIAL HISTORY:   Social History   Tobacco Use   Smoking status: Never   Smokeless tobacco: Never  Substance Use Topics   Alcohol use: No     ALLERGIES:   Allergies  Allergen Reactions   Ace Inhibitors Cough     CURRENT MEDICATIONS:   Current Outpatient Medications  Medication Sig Dispense Refill   ammonium lactate (AMLACTIN) 12 % cream ammonium lactate 12 % topical cream  APPLY TO DRY SKIN ON LEGS ARMS AND BACK ONCE TO TWICE DAILY AFTER SHOWERS (AVOID FACE OPEN CUTS AND GROIN)     aspirin 81 MG tablet Take 81 mg by mouth daily.     Cholecalciferol (VITAMIN D) 1000 UNITS capsule Take 1,000 Units by mouth daily.     donepezil (ARICEPT) 10 MG tablet donepezil 10 mg tablet  Take 1 tablet every day by oral route.     donepezil (ARICEPT) 5 MG tablet Take 5 mg by mouth daily.     fish oil-omega-3 fatty acids 1000 MG capsule Take 1 g by mouth 2 (two) times daily.     folic acid (FOLVITE) 400 MCG tablet Take 800 mcg by mouth daily.     furosemide (LASIX) 20 MG tablet Take 20 mg by mouth daily as needed.     hydrocortisone 2.5 % ointment hydrocortisone  2.5 % topical ointment     losartan (COZAAR) 50 MG tablet Take 1 tablet (50 mg total) by mouth daily. Takes 1/2 tablet Once Daily 30 tablet 3   memantine (NAMENDA) 10 MG tablet Take 10 mg by mouth 2 (two) times daily.     Multiple Vitamins-Minerals (CENTRUM SILVER PO) Take 1 capsule by mouth daily.     olmesartan (BENICAR) 20 MG tablet Take 10 mg by mouth daily. Take 1/2 of a 20mg  tablet once a day     permethrin (ELIMITE) 5 % cream Apply 1 application topically once.     pyridOXINE (VITAMIN B-6) 50 MG tablet Take 25 mg by mouth daily.     rosuvastatin (CRESTOR) 40 MG tablet Take 1 tablet by mouth once daily 90 tablet 3   triamcinolone cream (KENALOG) 0.1 % SMARTSIG:1 Application Topical 2-3 Times Daily     vitamin B-12 (CYANOCOBALAMIN) 250 MCG tablet Take 250 mcg by mouth daily.      amLODipine (NORVASC) 10 MG tablet Take 1 tablet (10 mg total) by mouth daily. 180 tablet 3   No current facility-administered medications for this visit.    REVIEW OF SYSTEMS:   [X]  denotes positive finding, [ ]  denotes negative finding Cardiac  Comments:  Chest pain or chest pressure:    Shortness of breath upon exertion:    Short of breath when lying flat:    Irregular heart rhythm:        Vascular    Pain in calf, thigh, or hip brought on by ambulation:    Pain in feet at night that wakes you up from your sleep:     Blood clot in your veins:    Leg swelling:  x       Pulmonary    Oxygen at home:    Productive cough:     Wheezing:         Neurologic    Sudden weakness in arms or legs:     Sudden numbness in arms or legs:     Sudden onset of difficulty speaking or slurred speech:    Temporary loss of vision in one eye:     Problems with dizziness:         Gastrointestinal    Blood in stool:     Vomited blood:         Genitourinary    Burning when urinating:     Blood in urine:        Psychiatric    Major depression:         Hematologic    Bleeding problems:    Problems with blood clotting too easily:        Skin    Rashes or ulcers:        Constitutional    Fever or chills:      PHYSICAL EXAM:   There were no vitals filed for this visit.  GENERAL: The patient is a well-nourished male, in no acute distress. The vital signs are documented above. CARDIAC: There is a regular rate and rhythm.  VASCULAR: 2+ bilateral lower extremity pitting edema PULMONARY: Non-labored respirations MUSCULOSKELETAL: There are no major deformities or cyanosis. NEUROLOGIC: No focal weakness or paresthesias are detected. SKIN: There are no ulcers or rashes noted. PSYCHIATRIC: The patient has a normal affect.  STUDIES:   I have reviewed the following studies: Carotids:  Right Carotid: Velocities in the right ICA are consistent with a 60-79%  stenosis.    Left Carotid: Velocities in the left ICA are consistent with a 1-39%  stenosis.   Venous reflux study: Right:  - No evidence of deep vein thrombosis seen in the right lower extremity,  from the common femoral through the popliteal veins.  - No evidence of superficial venous thrombosis in the right lower  extremity.  - Venous reflux is noted in the right greater saphenous vein in the thigh.     Left:  - No evidence of deep vein thrombosis seen in the left lower extremity,  from the common femoral through the popliteal veins.  - No evidence of superficial venous thrombosis in the left lower  extremity.  - Venous reflux is noted in the left sapheno-femoral junction.   MEDICAL ISSUES:   Carotid: The patient remains asymptomatic.  Ultrasound shows 60 to 70% stenosis on the right.  I discussed the signs and symptoms of stroke and to contact the emergency department should he have one of the symptoms.  Otherwise we will continue with his current medical regimen and repeat his ultrasound in 1 year.  Leg swelling: Ultrasound findings today show moderate great saphenous reflux bilaterally.  I am not convinced that reflux is the only etiology for his leg swelling.  I do not think that with laser ablation, we would resolve his edema issues in his legs, therefore I would recommend conservative treatment at this time with leg elevation and compression socks.    [name], MD, FACS Vascular and Vein Specialists of [facility] [phone] Pager [phone]

## 2021-07-04 ENCOUNTER — Telehealth (HOSPITAL_COMMUNITY): Payer: Self-pay | Admitting: *Deleted

## 2021-07-04 NOTE — Telephone Encounter (Signed)
Pts wife left vm stating pt has to have some teeth extractions and wants to know if pt should hold aspirin.  Routed to Dr.Bensimhon for advice

## 2021-07-06 NOTE — Telephone Encounter (Signed)
Spoke with pts wife she is aware

## 2021-08-13 ENCOUNTER — Other Ambulatory Visit (HOSPITAL_COMMUNITY): Payer: Self-pay | Admitting: Internal Medicine

## 2021-08-23 NOTE — Progress Notes (Signed)
?  ? ?Advanced [facility] Clinic Note ? ?Date:  08/24/2021  ? ?ID:  [name], DOB 1933/09/08, MRN [id number]  Location: Home  ?Provider location: [address], [place] ?Type of Visit: Established ? ?PCP:  Pomposini, Rande Brunt, MD  ?Cardiologist:  None ?Primary HF: DB ? ?Chief Complaint: CAD ?  ?History of Present Illness:  ? ?Azael Ragain is a 88 y.o. male with HTN, OSA. CAD s/p DES to LAD and RCA + PTCA diagonal in Sept 2010. In November 2010 had relook cath showing patent stent and small vessel dz, not favorable for PCI. ?  ?Carotid dopplers (3/13): Minimal disease.  ?Echo (10/15): EF 60-65%, mild dilation of aortic root and ascending aorta (4.2/4.0 cm), normal RV size and systolic function, aortic sclerosis without stenosis.  ?  ?Myoview 6/17: ?Nuclear stress EF: 55%. ?There was no ST segment deviation noted during stress. ?There is a small defect of mild severity present in the basal inferior and mid inferior location. The defect is partially-reversible. This is most consistent with variations in diaphragmatic attenuation. ?This is a low risk study. ?The left ventricular ejection fraction is normal (55-65%). ?  ?Echo 11/17: EF 65-70%. Grade I DD. Aortic sclerosis without stenosis. AoRoot 4.4cm  ? 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 ?Echo 11/20 EF 60-65% RV normal. Mild aortic sclerosis AoRoot stable at 4cm  ? ?Echo 3/22: EF 60-65% RV normal. Mild aortic sclerosis AoRoot stable at 4.1cm  ?  ?Here with his wife for routine f/u.  Says he feels ok. Recently fell off a chair and broke his ankle. Wife says he gets SOB with mild activity.  No CP or swelling. Dizziness improved. Compliant with meds. Doesn't move around much. Still struggling with memory.  ? ? ?Past Medical History:  ?Diagnosis Date  ? CAD (coronary artery disease)   ? aborted inf STEMI on treadmill 9/10; PCI of RCA, DES to LAD  with PTCA  of D2 9/10; relook cath 11/10-patent LAD stent with small branch vessel and dis RCA  disease  ? Carotid artery occlusion   ? CHF (congestive heart failure) (HCC)   ? Elevated PSA   ? HTN (hypertension)   ? Hyperlipidemia   ? OSA (obstructive sleep apnea)   ? probable  ? ?History reviewed. No pertinent surgical history. ? ? ?Current Outpatient Medications  ?Medication Sig Dispense Refill  ? amLODipine (NORVASC) 10 MG tablet Take 1 tablet (10 mg total) by mouth daily. 180 tablet 3  ? aspirin 81 MG tablet Take 81 mg by mouth daily.    ? Cholecalciferol (VITAMIN D) 1000 UNITS capsule Take 1,000 Units by mouth daily.    ? donepezil (ARICEPT) 10 MG tablet donepezil 10 mg tablet ? Take 1 tablet every day by oral route.    ? fish oil-omega-3 fatty acids 1000 MG capsule Take 1 g by mouth 2 (two) times daily.    ? folic acid (FOLVITE) 400 MCG tablet Take 800 mcg by mouth daily.    ? furosemide (LASIX) 20 MG tablet Take 20 mg by mouth daily as needed.    ? hydrocortisone 2.5 % ointment hydrocortisone 2.5 % topical ointment    ? losartan (COZAAR) 50 MG tablet Take 25 mg by mouth daily.    ? memantine (NAMENDA) 10 MG tablet Take 10 mg by mouth 2 (two) times daily.    ? Multiple Vitamins-Minerals (CENTRUM SILVER PO) Take 1 capsule by mouth daily.    ? olmesartan (BENICAR) 20 MG tablet  Take 10 mg by mouth daily. Take 1/2 of a 20mg  tablet once a day    ? pyridOXINE (VITAMIN B-6) 100 MG tablet Take 50 mg by mouth daily.    ? rosuvastatin (CRESTOR) 40 MG tablet Take 1 tablet by mouth once daily 90 tablet 0  ? triamcinolone cream (KENALOG) 0.1 % SMARTSIG:1 Application Topical 2-3 Times Daily    ? vitamin B-12 (CYANOCOBALAMIN) 250 MCG tablet Take 250 mcg by mouth daily.    ? ammonium lactate (AMLACTIN) 12 % cream ammonium lactate 12 % topical cream ? APPLY TO DRY SKIN ON LEGS ARMS AND BACK ONCE TO TWICE DAILY AFTER SHOWERS (AVOID FACE OPEN CUTS AND GROIN)    ? ?No current facility-administered medications for this encounter.  ? ? ?Allergies:   Ace inhibitors  ? ?Social History:  The patient  reports that he has never  smoked. He has never used smokeless tobacco. He reports that he does not drink alcohol and does not use drugs.  ? ?Family History:  The patient's family history includes Heart attack in his sister; Heart attack (age of onset: 80) in his father; Lung cancer in his mother; Pulmonary embolism in his father.  ? ?ROS:  Please see the history of present illness.   All other systems are personally reviewed and negative.  ? ?Vitals:  ? 08/24/21 1027  ?BP: 112/70  ?Pulse: 62  ?SpO2: 97%  ? ? ? ?Exam:   ?General:  Well appearing. No resp difficulty ?HEENT: normal ?Neck: supple. no JVD. Carotids 2+ bilat; no bruits. No lymphadenopathy or thryomegaly appreciated. ?Cor: PMI nondisplaced. Regular rate & rhythm. No rubs, gallops or murmurs. ?Lungs: clear ?Abdomen: soft, nontender, nondistended. No hepatosplenomegaly. No bruits or masses. Good bowel sounds. ?Extremities: no cyanosis, clubbing, rash, edema RLE boot ?Neuro: alert & orientedx3, cranial nerves grossly intact. moves all 4 extremities w/o difficulty. Affect pleasant ? ? ?Recent Labs: ?No results found for requested labs within last 8760 hours.  ?Personally reviewed  ? ?Wt Readings from Last 3 Encounters:  ?08/24/21 98.9 kg (218 lb)  ?04/30/21 96.2 kg (212 lb)  ?10/16/20 96.4 kg (212 lb 8 oz)  ?  ? ?ASSESSMENT AND PLAN: ? ?1) CAD: s/p DES to LAD in 2010, also with PTCA to RCA and diagonal in Sept. 2010.  ?- Stable. No s/s angina ?- He notes that his anginal equivalent is stomach pain. No s/s angina currently ?- Nuclear study in 11/2015 nonischemic.  ?- Continue ASA/statin ? ?2) HTN:  ?- Blood pressure well controlled. Continue current regimen. ?  ?3) Hyperlipidemia:  ?- Goal LDL < 70. Followed by PCP. GOal LDL < 70 ? ?4) Carotid stenosis ?- RICA 60-70% followed by Dr. 12/2015 with [facility] ? ?5) Aortic area murmur:  ?- Echo 11/20 with AoV sclerosis without stenosis.  ?- Stable on echo 3/22  ?  ?7) Aortic root aneurysm:  ?- Chest CT 02/2018 with aortic root 4.1 Stable ?- echo  11/20 AoRoot stable at 4.0cm ?- Echo 3/22: EF 60-65% RV normal. Mild aortic sclerosis AoRoot stable at 4.1cm  ?  ?8) LE edema ?- due to venous stasis.  ?- U/s with [facility] -> moderate great saphenous reflux bilaterally. ?- refuses support stockings ? ?Signed, ?4/22, MD  ?08/24/2021 ?11:01 AM ? ?[facility] ?[address] ?[facility] ?[place] ?[phone] (office) ?[phone] (fax) ? ?

## 2021-08-24 ENCOUNTER — Encounter (HOSPITAL_COMMUNITY): Payer: Self-pay | Admitting: Internal Medicine

## 2021-08-24 ENCOUNTER — Ambulatory Visit (HOSPITAL_COMMUNITY)
Admission: RE | Admit: 2021-08-24 | Discharge: 2021-08-24 | Disposition: A | Discharge status: Home / Self Care | Payer: Medicare Other | Source: Ambulatory Visit | Attending: Internal Medicine | Admitting: Internal Medicine

## 2021-08-24 ENCOUNTER — Other Ambulatory Visit: Payer: Self-pay

## 2021-08-24 VITALS — BP 112/70 | HR 62 | Wt 218.0 lb

## 2021-08-24 DIAGNOSIS — Q2543 Congenital aneurysm of aorta: Secondary | ICD-10-CM | POA: Insufficient documentation

## 2021-08-24 DIAGNOSIS — R0602 Shortness of breath: Secondary | ICD-10-CM | POA: Insufficient documentation

## 2021-08-24 DIAGNOSIS — I2510 Atherosclerotic heart disease of native coronary artery without angina pectoris: Secondary | ICD-10-CM | POA: Insufficient documentation

## 2021-08-24 DIAGNOSIS — I7121 Aneurysm of the ascending aorta, without rupture: Secondary | ICD-10-CM

## 2021-08-24 DIAGNOSIS — G4733 Obstructive sleep apnea (adult) (pediatric): Secondary | ICD-10-CM | POA: Insufficient documentation

## 2021-08-24 DIAGNOSIS — Z955 Presence of coronary angioplasty implant and graft: Secondary | ICD-10-CM | POA: Diagnosis not present

## 2021-08-24 DIAGNOSIS — I110 Hypertensive heart disease with heart failure: Secondary | ICD-10-CM | POA: Diagnosis not present

## 2021-08-24 DIAGNOSIS — Z7982 Long term (current) use of aspirin: Secondary | ICD-10-CM | POA: Diagnosis not present

## 2021-08-24 DIAGNOSIS — R42 Dizziness and giddiness: Secondary | ICD-10-CM | POA: Diagnosis not present

## 2021-08-24 DIAGNOSIS — I872 Venous insufficiency (chronic) (peripheral): Secondary | ICD-10-CM | POA: Diagnosis not present

## 2021-08-24 DIAGNOSIS — R011 Cardiac murmur, unspecified: Secondary | ICD-10-CM | POA: Insufficient documentation

## 2021-08-24 DIAGNOSIS — R109 Unspecified abdominal pain: Secondary | ICD-10-CM | POA: Diagnosis not present

## 2021-08-24 DIAGNOSIS — I509 Heart failure, unspecified: Secondary | ICD-10-CM | POA: Insufficient documentation

## 2021-08-24 DIAGNOSIS — I10 Essential (primary) hypertension: Secondary | ICD-10-CM | POA: Diagnosis not present

## 2021-08-24 DIAGNOSIS — I6521 Occlusion and stenosis of right carotid artery: Secondary | ICD-10-CM

## 2021-08-24 DIAGNOSIS — E785 Hyperlipidemia, unspecified: Secondary | ICD-10-CM | POA: Diagnosis not present

## 2021-08-24 DIAGNOSIS — Z79899 Other long term (current) drug therapy: Secondary | ICD-10-CM | POA: Diagnosis not present

## 2021-08-24 NOTE — Patient Instructions (Signed)
Thank you for your visit today. ? ?There has been no changes to your medications. ? ?Your physician recommends that you schedule a follow-up appointment in: 1 year (March 2024)  ** please call the office in January to arrange your follow up ** ? ?If you have any questions or concerns before your next appointment please send [place] a message through [place] or call our office at [phone].   ? ?TO LEAVE A MESSAGE FOR THE NURSE SELECT OPTION 2, PLEASE LEAVE A MESSAGE INCLUDING: ?YOUR NAME ?DATE OF BIRTH ?CALL BACK NUMBER ?REASON FOR CALL**this is important as we prioritize the call backs ? ?YOU WILL RECEIVE A CALL BACK THE SAME DAY AS LONG AS YOU CALL BEFORE 4:00 PM ? ?At the [facility], you and your health needs are our priority. As part of our continuing mission to provide you with exceptional heart care, we have created designated Provider Care Teams. These Care Teams include your primary Cardiologist (physician) and Advanced Practice Providers (APPs- Physician Assistants and Nurse Practitioners) who all work together to provide you with the care you need, when you need it.  ? ?You may see any of the following providers on your designated Care Team at your next follow up: ?Dr Arvilla Meres ?Dr Marca Ancona ?Tonye Becket, NP ?Robbie Lis, PA ?Jessica Milford,NP ?Anna Genre, PA ?Karle Plumber, PharmD ? ? ?Please be sure to bring in all your medications bottles to every appointment.  ? ? ?

## 2021-08-24 NOTE — Addendum Note (Signed)
Encounter addended by: Linda Hedges, RN on: 08/24/2021 11:09 AM ? Actions taken: Clinical Note Signed

## 2021-11-18 ENCOUNTER — Other Ambulatory Visit (HOSPITAL_COMMUNITY): Payer: Self-pay | Admitting: Internal Medicine

## 2022-02-14 DIAGNOSIS — J302 Other seasonal allergic rhinitis: Secondary | ICD-10-CM | POA: Insufficient documentation

## 2022-03-06 DIAGNOSIS — F330 Major depressive disorder, recurrent, mild: Secondary | ICD-10-CM | POA: Insufficient documentation

## 2022-06-21 ENCOUNTER — Emergency Department (HOSPITAL_BASED_OUTPATIENT_CLINIC_OR_DEPARTMENT_OTHER)
Admission: EM | Admit: 2022-06-21 | Discharge: 2022-06-21 | Disposition: A | Discharge status: Home / Self Care | Payer: Medicare Other | Attending: Emergency Medicine | Admitting: Emergency Medicine

## 2022-06-21 ENCOUNTER — Emergency Department (HOSPITAL_BASED_OUTPATIENT_CLINIC_OR_DEPARTMENT_OTHER): Payer: Medicare Other

## 2022-06-21 ENCOUNTER — Other Ambulatory Visit: Payer: Self-pay

## 2022-06-21 ENCOUNTER — Telehealth (HOSPITAL_COMMUNITY): Payer: Self-pay

## 2022-06-21 ENCOUNTER — Encounter (HOSPITAL_BASED_OUTPATIENT_CLINIC_OR_DEPARTMENT_OTHER): Payer: Self-pay | Admitting: Emergency Medicine

## 2022-06-21 DIAGNOSIS — I110 Hypertensive heart disease with heart failure: Secondary | ICD-10-CM | POA: Insufficient documentation

## 2022-06-21 DIAGNOSIS — I509 Heart failure, unspecified: Secondary | ICD-10-CM | POA: Diagnosis not present

## 2022-06-21 DIAGNOSIS — I2510 Atherosclerotic heart disease of native coronary artery without angina pectoris: Secondary | ICD-10-CM | POA: Diagnosis not present

## 2022-06-21 DIAGNOSIS — Z79899 Other long term (current) drug therapy: Secondary | ICD-10-CM | POA: Diagnosis not present

## 2022-06-21 DIAGNOSIS — R55 Syncope and collapse: Secondary | ICD-10-CM | POA: Diagnosis present

## 2022-06-21 DIAGNOSIS — Z7982 Long term (current) use of aspirin: Secondary | ICD-10-CM | POA: Diagnosis not present

## 2022-06-21 LAB — URINALYSIS, ROUTINE W REFLEX MICROSCOPIC
Bilirubin Urine: NEGATIVE
Glucose, UA: NEGATIVE mg/dL
Hgb urine dipstick: NEGATIVE
Ketones, ur: NEGATIVE mg/dL
Leukocytes,Ua: NEGATIVE
Nitrite: NEGATIVE
Specific Gravity, Urine: 1.024 (ref 1.005–1.030)
pH: 6 (ref 5.0–8.0)

## 2022-06-21 LAB — CBC
HCT: 41 % (ref 39.0–52.0)
Hemoglobin: 13.6 g/dL (ref 13.0–17.0)
MCH: 32.8 pg (ref 26.0–34.0)
MCHC: 33.2 g/dL (ref 30.0–36.0)
MCV: 98.8 fL (ref 80.0–100.0)
Platelets: 137 10*3/uL — ABNORMAL LOW (ref 150–400)
RBC: 4.15 MIL/uL — ABNORMAL LOW (ref 4.22–5.81)
RDW: 12.9 % (ref 11.5–15.5)
WBC: 7.8 10*3/uL (ref 4.0–10.5)
nRBC: 0 % (ref 0.0–0.2)

## 2022-06-21 LAB — BASIC METABOLIC PANEL
Anion gap: 9 (ref 5–15)
BUN: 20 mg/dL (ref 8–23)
CO2: 29 mmol/L (ref 22–32)
Calcium: 9.5 mg/dL (ref 8.9–10.3)
Chloride: 101 mmol/L (ref 98–111)
Creatinine, Ser: 1.4 mg/dL — ABNORMAL HIGH (ref 0.61–1.24)
GFR, Estimated: 49 mL/min — ABNORMAL LOW (ref >60)
Glucose, Bld: 98 mg/dL (ref 70–99)
Potassium: 4.3 mmol/L (ref 3.5–5.1)
Sodium: 139 mmol/L (ref 135–145)

## 2022-06-21 LAB — TROPONIN I (HIGH SENSITIVITY)
Troponin I (High Sensitivity): 7 ng/L (ref <18)
Troponin I (High Sensitivity): 8 ng/L (ref <18)

## 2022-06-21 LAB — CBG MONITORING, ED: Glucose-Capillary: 90 mg/dL (ref 70–99)

## 2022-06-21 NOTE — ED Triage Notes (Signed)
Pt arrives pov, slow gait, endorses syncope episode last night when sitting in chair. Reports similar episode a few week prior to this episode when showering, 2 other episodes also mentioned. Denies fall last night.

## 2022-06-21 NOTE — ED Provider Notes (Signed)
[facility] EMERGENCY DEPT Provider Note   CSN: [id number] Arrival date & time: 06/21/22  1402     History  Chief Complaint  Patient presents with   Loss of Consciousness    Chris Guzman is a 88 y.o. male.   Loss of Consciousness Patient with syncopal episode.  Episode today.  Was reported sitting in chair and then passed out.  No injury.  Reportedly had a episode a few weeks with similar symptoms although he was taken a shower at the time.  Another previous similar episode when he was sitting at the counter.  Patient states he thinks he just fell asleep but does not remember the episode.  No injury.  No chest pain.  Back at baseline now.  Has been doing well the last few days but per patient's daughter does not eat and drink much.  Patient is eager to go home.    Past Medical History:  Diagnosis Date   CAD (coronary artery disease)    aborted inf STEMI on treadmill 9/10; PCI of RCA, DES to LAD  with PTCA  of D2 9/10; relook cath 11/10-patent LAD stent with small branch vessel and dis RCA disease   Carotid artery occlusion    CHF (congestive heart failure) (HCC)    Elevated PSA    HTN (hypertension)    Hyperlipidemia    OSA (obstructive sleep apnea)    probable    Home Medications Prior to Admission medications   Medication Sig Start Date End Date Taking? Authorizing Provider  amLODipine (NORVASC) 10 MG tablet Take 1 tablet (10 mg total) by mouth daily. 09/09/18 08/25/22  Laurey Morale, MD  ammonium lactate (AMLACTIN) 12 % cream ammonium lactate 12 % topical cream  APPLY TO DRY SKIN ON LEGS ARMS AND BACK ONCE TO TWICE DAILY AFTER SHOWERS (AVOID FACE OPEN CUTS AND GROIN)    [provider]  aspirin 81 MG tablet Take 81 mg by mouth daily.    [provider]  Cholecalciferol (VITAMIN D) 1000 UNITS capsule Take 1,000 Units by mouth daily.    [provider]  donepezil (ARICEPT) 10 MG tablet donepezil 10 mg tablet  Take 1 tablet every  day by oral route.    [provider]  fish oil-omega-3 fatty acids 1000 MG capsule Take 1 g by mouth 2 (two) times daily.    [provider]  folic acid (FOLVITE) 400 MCG tablet Take 800 mcg by mouth daily.    [provider]  furosemide (LASIX) 20 MG tablet Take 20 mg by mouth daily as needed.    [provider]  hydrocortisone 2.5 % ointment hydrocortisone 2.5 % topical ointment    [provider]  losartan (COZAAR) 50 MG tablet Take 25 mg by mouth daily.    [provider]  memantine (NAMENDA) 10 MG tablet Take 10 mg by mouth 2 (two) times daily. 03/09/20   [provider]  Multiple Vitamins-Minerals (CENTRUM SILVER PO) Take 1 capsule by mouth daily.    [provider]  olmesartan (BENICAR) 20 MG tablet Take 10 mg by mouth daily. Take 1/2 of a 20mg  tablet once a day    [provider]  pyridOXINE (VITAMIN B-6) 100 MG tablet Take 50 mg by mouth daily.    [provider]  rosuvastatin (CRESTOR) 40 MG tablet Take 1 tablet by mouth once daily 11/20/21   Bensimhon, 11/22/21, MD  triamcinolone cream (KENALOG) 0.1 % SMARTSIG:1 Application Topical 2-3 Times Daily  04/03/20   [provider]  vitamin B-12 (CYANOCOBALAMIN) 250 MCG tablet Take 250 mcg by mouth daily.    [provider]      Allergies    Ace inhibitors    Review of Systems   Review of Systems  Cardiovascular:  Positive for syncope.    Physical Exam Updated Vital Signs BP 133/70   Pulse (!) 57   Temp 98.5 F (36.9 C)   Resp 16   Ht 5\' 11"  (1.803 m)   Wt 95.7 kg   SpO2 99%   BMI 29.43 kg/m  Physical Exam Vitals and nursing note reviewed.  Eyes:     Extraocular Movements: Extraocular movements intact.  Cardiovascular:     Rate and Rhythm: Regular rhythm.  Pulmonary:     Breath sounds: No wheezing or rhonchi.  Abdominal:     Tenderness: There is no abdominal tenderness.  Musculoskeletal:        General: No  tenderness.  Skin:    General: Skin is warm.     Capillary Refill: Capillary refill takes less than 2 seconds.  Neurological:     Mental Status: He is alert.     ED Results / Procedures / Treatments   Labs (all labs ordered are listed, but only abnormal results are displayed) Labs Reviewed  BASIC METABOLIC PANEL  CBC  URINALYSIS, ROUTINE W REFLEX MICROSCOPIC  CBG MONITORING, ED  TROPONIN I (HIGH SENSITIVITY)    EKG EKG Interpretation  Date/Time:  Friday June 21 2022 14:33:26 EST Ventricular Rate:  55 PR Interval:  176 QRS Duration: 113 QT Interval:  437 QTC Calculation: 418 R Axis:   -26 Text Interpretation: Sinus rhythm Incomplete right bundle branch block Confirmed by 08-24[birth year]1985 [phone]) on 06/21/2022 2:38:13 PM  Radiology No results found.  Procedures Procedures    Medications Ordered in ED Medications - No data to display  ED Course/ Medical Decision Making/ A&P                           Medical Decision Making Amount and/or Complexity of Data Reviewed Labs: ordered.    patient with syncopal episode.  Happened while sitting.  Not exertional.  [place] previous echocardiogram from few years ago.  Will get blood work.  Differential diagnosis includes arrhythmia, dehydration, vasovagal episode. Care will be turned over to Dr. 06/23/2022        Final Clinical Impression(s) / ED Diagnoses Final diagnoses:  Syncope, unspecified syncope type    Rx / DC Orders ED Discharge Orders     None         Clarice Pole, MD 06/21/22 1513

## 2022-06-21 NOTE — ED Provider Notes (Signed)
Syncope w/u.  Physical Exam  BP 135/82   Pulse (!) 52   Temp 98.5 F (36.9 C)   Resp 16   Ht 5\' 11"  (1.803 m)   Wt 95.7 kg   SpO2 96%   BMI 29.43 kg/m   Physical Exam  Procedures  Procedures  ED Course / MDM    Medical Decision Making Amount and/or Complexity of Data Reviewed Labs: ordered. Radiology: ordered.   Patient's wife had concern for possible seizure-like activity and the episodes that she is observed over the past several days.  Patient has several times had episodes in a seated position whereby he was difficult to arouse and seemed to be staring and unresponsive.  She reports she has noticed at times especially when he is lying down some repetitive swallowing type sounds.  Patient does have Alzheimer's dementia.  He is alert and cheerful.  He does not have any recall for these events.  He reports he feels fine.  He does not notice any headache, chest pain or other complaints.  2 sets of troponins normal.  I have added CT head based on description and concerns for possible neurologic event with these altered mental status episodes.  CT head reviewed by radiology no acute findings.  No intracranial hemorrhage.  At discharge the patient is alert and cheerful.  He is working on a crossword puzzle.  He shows no signs of distress or agitation.  Vital signs are stable.  We reviewed a plan of expeditious follow-up with neurology to determine if EEG would be appropriate.  Also follow-up with PCP and cardiology.  Family members agree with plan and all results reviewed.       , MD 06/21/22 06/23/22

## 2022-06-21 NOTE — Telephone Encounter (Signed)
Chris Guzman called and states he has had episodes 4 times over the past several months where he stops breathing, may fall, where he is "out of it" - concerned about what might be causing. Possible seizure?  We encouraged her to get in touch with his PCP to evaluate and also to bring him to the ED if he continues to have these episodes.

## 2022-06-21 NOTE — Telephone Encounter (Signed)
I spoke with Chris Guzman and she is going to have him transported to area ED via ambulance to be evaluated.

## 2022-07-02 DIAGNOSIS — I2510 Atherosclerotic heart disease of native coronary artery without angina pectoris: Secondary | ICD-10-CM | POA: Insufficient documentation

## 2022-07-02 DIAGNOSIS — R918 Other nonspecific abnormal finding of lung field: Secondary | ICD-10-CM | POA: Insufficient documentation

## 2022-07-04 ENCOUNTER — Telehealth (HOSPITAL_COMMUNITY): Payer: Self-pay | Admitting: Vascular Surgery

## 2022-07-04 NOTE — Telephone Encounter (Signed)
Lvm for pt to make ER f/u w/ app/np next ava

## 2022-07-05 ENCOUNTER — Encounter: Payer: Self-pay | Admitting: Surgery

## 2022-07-05 ENCOUNTER — Other Ambulatory Visit (HOSPITAL_COMMUNITY): Payer: Self-pay | Admitting: Surgery

## 2022-07-05 ENCOUNTER — Ambulatory Visit (HOSPITAL_COMMUNITY)
Admission: RE | Admit: 2022-07-05 | Discharge: 2022-07-05 | Disposition: A | Discharge status: Home / Self Care | Payer: Medicare Other | Source: Ambulatory Visit | Attending: Surgery | Admitting: Surgery

## 2022-07-05 ENCOUNTER — Ambulatory Visit (INDEPENDENT_AMBULATORY_CARE_PROVIDER_SITE_OTHER): Payer: Medicare Other | Admitting: Surgery

## 2022-07-05 VITALS — BP 112/75 | HR 69 | Temp 98.2°F | Resp 20 | Ht 71.0 in | Wt 215.0 lb

## 2022-07-05 DIAGNOSIS — I779 Disorder of arteries and arterioles, unspecified: Secondary | ICD-10-CM

## 2022-07-05 DIAGNOSIS — I6523 Occlusion and stenosis of bilateral carotid arteries: Secondary | ICD-10-CM

## 2022-07-05 NOTE — Progress Notes (Signed)
[facility]  Patient name: Chris Guzman MRN: [id number] DOB: 1933/07/05 Sex: male   REASON FOR VISIT:    Follow up  HISOTRY OF PRESENT ILLNESS:    Chris Guzman is a 89 y.o. male that I met in April 2022 for evaluation of carotid stenosis.  At that time, the patient was having issues with dizziness which led to a MRI showing a high-grade right carotid stenosis.  His dizziness episodes ultimately resolved.  His carotid ultrasound only showed a 60-79% right-sided stenosis with minimal left-sided disease.  He was asymptomatic and so no intervention was performed.  Back for follow-up.  He has had additional syncopal episodes which he is amnestic to.  Patient has history of coronary artery disease.  He is status post PCI in 2010.  He has a history of congestive heart failure with diastolic dysfunction.  He is medically managed for hypertension.  He is a non-smoker.  He is on a statin for hypercholesterolemia.  He does complain of worsening edema bilaterally, right greater than left that has been significantly improved with compression socks PAST MEDICAL HISTORY:   Past Medical History:  Diagnosis Date   CAD (coronary artery disease)    aborted inf STEMI on treadmill 9/10; PCI of RCA, DES to LAD  with PTCA  of D2 9/10; relook cath 11/10-patent LAD stent with small branch vessel and dis RCA disease   Carotid artery occlusion    CHF (congestive heart failure) (HCC)    Elevated PSA    HTN (hypertension)    Hyperlipidemia    OSA (obstructive sleep apnea)    probable     FAMILY HISTORY:   Family History  Problem Relation Age of Onset   Pulmonary embolism Father    Heart attack Father 34   Lung cancer Mother    Heart attack Sister     SOCIAL HISTORY:   Social History   Tobacco Use   Smoking status: Never   Smokeless tobacco: Never  Substance Use Topics   Alcohol use: No     ALLERGIES:   Allergies   Allergen Reactions   Ace Inhibitors Cough     CURRENT MEDICATIONS:   Current Outpatient Medications  Medication Sig Dispense Refill   amLODipine (NORVASC) 10 MG tablet Take 1 tablet (10 mg total) by mouth daily. 180 tablet 3   ammonium lactate (AMLACTIN) 12 % cream ammonium lactate 12 % topical cream  APPLY TO DRY SKIN ON LEGS ARMS AND BACK ONCE TO TWICE DAILY AFTER SHOWERS (AVOID FACE OPEN CUTS AND GROIN)     aspirin 81 MG tablet Take 81 mg by mouth daily.     Cholecalciferol (VITAMIN D) 1000 UNITS capsule Take 1,000 Units by mouth daily.     donepezil (ARICEPT) 10 MG tablet donepezil 10 mg tablet  Take 1 tablet every day by oral route.     fish oil-omega-3 fatty acids 1000 MG capsule Take 1 g by mouth 2 (two) times daily.     folic acid (FOLVITE) 268 MCG tablet Take 800 mcg by mouth daily.     furosemide (LASIX) 20 MG tablet Take 20 mg by mouth daily as needed.     hydrocortisone 2.5 % ointment hydrocortisone 2.5 % topical ointment     losartan (COZAAR) 50 MG tablet Take 25 mg by mouth daily.     memantine (NAMENDA) 10 MG tablet Take 10 mg by mouth 2 (two) times daily.     Multiple Vitamins-Minerals (CENTRUM SILVER PO)  Take 1 capsule by mouth daily.     olmesartan (BENICAR) 20 MG tablet Take 10 mg by mouth daily. Take 1/2 of a 20mg  tablet once a day     pyridOXINE (VITAMIN B-6) 100 MG tablet Take 50 mg by mouth daily.     rosuvastatin (CRESTOR) 40 MG tablet Take 1 tablet by mouth once daily 90 tablet 3   triamcinolone cream (KENALOG) 0.1 % SMARTSIG:1 Application Topical 2-3 Times Daily     vitamin B-12 (CYANOCOBALAMIN) 250 MCG tablet Take 250 mcg by mouth daily.     No current facility-administered medications for this visit.    REVIEW OF SYSTEMS:   [X]  denotes positive finding, [ ]  denotes negative finding Cardiac  Comments:  Chest pain or chest pressure:    Shortness of breath upon exertion:    Short of breath when lying flat:    Irregular heart rhythm:        Vascular     Pain in calf, thigh, or hip brought on by ambulation:    Pain in feet at night that wakes you up from your sleep:     Blood clot in your veins:    Leg swelling:  x       Pulmonary    Oxygen at home:    Productive cough:     Wheezing:         Neurologic    Sudden weakness in arms or legs:     Sudden numbness in arms or legs:     Sudden onset of difficulty speaking or slurred speech:    Temporary loss of vision in one eye:     Problems with dizziness:         Gastrointestinal    Blood in stool:     Vomited blood:         Genitourinary    Burning when urinating:     Blood in urine:        Psychiatric    Major depression:         Hematologic    Bleeding problems:    Problems with blood clotting too easily:        Skin    Rashes or ulcers:        Constitutional    Fever or chills:      PHYSICAL EXAM:   Vitals:   07/05/22 1049 07/05/22 1051  BP: 130/81 112/75  Pulse: 69   Resp: 20   Temp: 98.2 F (36.8 C)   SpO2: 93%   Weight: 215 lb (97.5 kg)   Height: 5\' 11"  (1.803 m)     GENERAL: The patient is a well-nourished male, in no acute distress. The vital signs are documented above. CARDIAC: There is a regular rate and rhythm.  PULMONARY: Non-labored respirations MUSCULOSKELETAL: There are no major deformities or cyanosis. NEUROLOGIC: No focal weakness or paresthesias are detected. SKIN: There are no ulcers or rashes noted. PSYCHIATRIC: The patient has a normal affect.  STUDIES:   I have reviewed the following;  Right Carotid: Velocities in the right ICA are consistent with a 60-79%                 stenosis.   Left Carotid: Velocities in the left ICA are consistent with a 1-39%  stenosis.   Vertebrals: Bilateral vertebral arteries demonstrate antegrade flow.  Subclavians: Normal flow hemodynamics were seen in bilateral subclavian               arteries.  MEDICAL ISSUES:   Carotid: There is been no significant interval change with 60-79% stable  stenosis.  Vertebral arteries are antegrade.  I do not think his extracranial carotid and vertebral disease are contributing to his syncopal episodes.  He will follow-up in 6 months with repeat ultrasound  Leg swelling: He is getting good relief from compression socks.    [name], MD, FACS [facility] [phone] Pager [phone]

## 2022-07-10 ENCOUNTER — Other Ambulatory Visit: Payer: Self-pay

## 2022-07-10 DIAGNOSIS — I6523 Occlusion and stenosis of bilateral carotid arteries: Secondary | ICD-10-CM

## 2022-07-16 DIAGNOSIS — K7689 Other specified diseases of liver: Secondary | ICD-10-CM | POA: Insufficient documentation

## 2022-07-16 DIAGNOSIS — Q6102 Congenital multiple renal cysts: Secondary | ICD-10-CM | POA: Insufficient documentation

## 2022-07-19 NOTE — Progress Notes (Signed)
[facility] Note  Date:  07/22/2022   ID:  [name], DOB 1933/12/10, MRN [id number]  Location: Home  Provider location: [address] [place] Type of Visit: Established  PCP:  Guzman, Chris Anderson, MD  HF Cardiologist: Dr. Haroldine Guzman  Chief Complaint: cardiac follow up   HPI: Chris Guzman is a 89 y.o. male with HTN, OSA. CAD s/p DES to LAD and RCA + PTCA diagonal in Sept 2010. In November 2010 had relook cath showing patent stent and small vessel dz, not favorable for PCI.   Carotid dopplers (3/13): Minimal disease.  Echo (10/15): EF 60-65%, mild dilation of aortic root and ascending aorta (4.2/4.0 cm), normal RV size and systolic function, aortic sclerosis without stenosis.    Myoview 6/17: Nuclear stress EF: 55%. There was no ST segment deviation noted during stress. There is a small defect of mild severity present in the basal inferior and mid inferior location. The defect is partially-reversible. This is most consistent with variations in diaphragmatic attenuation. This is a low risk study. The left ventricular ejection fraction is normal (55-65%).   Echo 11/17: EF 65-70%. Grade I DD. Aortic sclerosis without stenosis. 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 Mild aortic sclerosis AoRoot stable at 4cm   Echo 3/22: EF 60-65% RV normal. Mild aortic sclerosis AoRoot stable at 4.1cm    Follow up 3/23, mild SOB with activity. Struggling with memory. LE swelling from venous stasis.  Seen in ED 07/22/21 after wife noticed several episodes of him staring and unresponsive, had difficulty arousing him. Patient had no recollection of episodes. CT head negative for acute findings. Hs Troponin negative x 2. Recommended expedited neurology follow up for ? Seizure.  Saw Dr. Trula Guzman 07/05/22, did not feel extracranial carotid or vertebral disease contributing to syncopal episodes. Repeat dopplers  stable with RICA 60-70%  Today he returns for post ED evaluation follow up with his wife. Overall feeling fine. He has not had anymore unresponsive episodes.  He is not short of breath walking on flat ground. Mostly sits during the day, enjoys crossword puzzles. Has a cough at night, unproductive and this worries his wife. Denies palpitations, abnormal bleeding, CP, dizziness, edema, or PND/Orthopnea. Appetite ok. No fever or chills. Weight at home 215 pounds. Taking all medications. Has Neuro visit in a couple weeks.   Past Medical History:  Diagnosis Date   CAD (coronary artery disease)    aborted inf STEMI on treadmill 9/10; PCI of RCA, DES to LAD  with PTCA  of D2 9/10; relook cath 11/10-patent LAD stent with small branch vessel and dis RCA disease   Carotid artery occlusion    CHF (congestive heart failure) (HCC)    Elevated PSA    HTN (hypertension)    Hyperlipidemia    OSA (obstructive sleep apnea)    probable   No past surgical history on file.   Current Outpatient Medications  Medication Sig Dispense Refill   amLODipine (NORVASC) 10 MG tablet Take 1 tablet (10 mg total) by mouth daily. 180 tablet 3   ammonium lactate (AMLACTIN) 12 % cream ammonium lactate 12 % topical cream  APPLY TO DRY SKIN ON LEGS ARMS AND BACK ONCE TO TWICE DAILY AFTER SHOWERS (AVOID FACE OPEN CUTS AND GROIN)     aspirin 81 MG tablet Take 81 mg by mouth daily.     Cholecalciferol (VITAMIN D) 1000 UNITS capsule Take  1,000 Units by mouth daily.     donepezil (ARICEPT) 10 MG tablet donepezil 10 mg tablet  Take 1 tablet every day by oral route.     fish oil-omega-3 fatty acids 1000 MG capsule Take 1 g by mouth 2 (two) times daily.     folic acid (FOLVITE) 841 MCG tablet Take 800 mcg by mouth daily.     furosemide (LASIX) 20 MG tablet Take 20 mg by mouth daily as needed.     hydrocortisone 2.5 % ointment hydrocortisone 2.5 % topical ointment     losartan (COZAAR) 50 MG tablet Take 25 mg by mouth daily.      memantine (NAMENDA) 10 MG tablet Take 10 mg by mouth 2 (two) times daily.     Multiple Vitamins-Minerals (CENTRUM SILVER PO) Take 1 capsule by mouth daily.     olmesartan (BENICAR) 20 MG tablet Take 10 mg by mouth daily. Take 1/2 of a 20mg  tablet once a day     pyridOXINE (VITAMIN B-6) 100 MG tablet Take 50 mg by mouth daily.     rosuvastatin (CRESTOR) 40 MG tablet Take 1 tablet by mouth once daily 90 tablet 3   triamcinolone cream (KENALOG) 0.1 % SMARTSIG:1 Application Topical 2-3 Times Daily     vitamin B-12 (CYANOCOBALAMIN) 250 MCG tablet Take 250 mcg by mouth daily.     No current facility-administered medications for this encounter.    Allergies:   Ace inhibitors   Social History:  The patient  reports that he has never smoked. He has never used smokeless tobacco. He reports that he does not drink alcohol and does not use drugs.   Family History:  The patient's family history includes Heart attack in his sister; Heart attack (age of onset: 70) in his father; Lung cancer in his mother; Pulmonary embolism in his father.   ROS:  Please see the history of present illness.   All other systems are personally reviewed and negative.   Recent Labs: 06/21/2022: BUN 20; Creatinine, Ser 1.40; Hemoglobin 13.6; Platelets 137; Potassium 4.3; Sodium 139  Personally reviewed   Wt Readings from Last 3 Encounters:  07/22/22 99.8 kg (220 lb)  07/05/22 97.5 kg (215 lb)  06/21/22 95.7 kg (211 lb)   BP 110/72   Pulse 60   Wt 99.8 kg (220 lb)   SpO2 97%   BMI 30.68 kg/m   Physical Exam:   General:  NAD. No resp difficulty, walked into clinic, elderly HEENT: Normal Neck: Supple. No JVD. Carotids 2+ bilat; no bruits. No lymphadenopathy or thryomegaly appreciated. Cor: PMI nondisplaced. Regular rate & rhythm. No rubs, gallops or murmurs. Lungs: Clear, diminished in lower lobes. Abdomen: Soft, nontender, nondistended. No hepatosplenomegaly. No bruits or masses. Good bowel sounds. Extremities: No  cyanosis, clubbing, rash, 1+ BLE pre-tibial edema, compression hose on. Neuro: Alert & oriented x 3, cranial nerves grossly intact. Moves all 4 extremities w/o difficulty. Affect pleasant.   ECG (personally reviewed): NSR 60 bpm  Assessment & Plan Syncope - Unclear etiology.  - Place Zio 2 week to look for arrhythmia. - He has Neurology appt in 2 weeks.  - Update echo. - Labs from ED visit 06/21/22 reviewed; K 4.3, SCr 1.4, Hgb 13.6  2.  CAD - s/p DES to LAD in 2010, also with PTCA to RCA and diagonal in 02/2009 - Nuclear study in 11/2015 nonischemic.  - He notes that his anginal equivalent is stomach pain. No s/s angina currently - Continue ASA/statin.  3.  HTN  - Blood pressure well controlled, no low home BPs per wife. - Continue current regimen.   4.  Hyperlipidemia  - Goal LDL < 70.  - Continue rosuvastatin - Followed by PCP.   5.  Carotid stenosis - RICA (1/24) 60-70% followed by Dr. Myra Gianotti with [facility].  6.  Aortic area murmur  - Echo 11/20 with AoV sclerosis without stenosis.  - Stable on echo 3/22.  - Repeat echo.   7.  Aortic root aneurysm  - Chest CT 02/2018 with aortic root 4.1 Stable - Echo 11/20 Ao Root stable at 4.0cm - Echo 3/22: EF 60-65% RV normal. Mild aortic sclerosis AoRoot stable at 4.1cm  - Repeat echo.   8. LE edema - due to venous stasis.  - U/s with [facility] -> moderate great saphenous reflux bilaterally. - Continue support stockings and PRN lasix.  Follow up in 3 months with Dr. Gala Romney.  Cydney Ok, FNP  07/22/2022 2:20 PM  [facility] [address] [facility] [place] [address] [phone] (office) [phone] (fax)

## 2022-07-22 ENCOUNTER — Inpatient Hospital Stay (HOSPITAL_COMMUNITY)
Admission: RE | Admit: 2022-07-22 | Discharge: 2022-07-22 | Disposition: A | Discharge status: Home / Self Care | Payer: Medicare Other | Source: Ambulatory Visit | Attending: Internal Medicine | Admitting: Internal Medicine

## 2022-07-22 ENCOUNTER — Other Ambulatory Visit (HOSPITAL_COMMUNITY): Payer: Self-pay | Admitting: Internal Medicine

## 2022-07-22 ENCOUNTER — Encounter (HOSPITAL_COMMUNITY): Payer: Self-pay

## 2022-07-22 ENCOUNTER — Ambulatory Visit (HOSPITAL_COMMUNITY)
Admission: RE | Admit: 2022-07-22 | Discharge: 2022-07-22 | Disposition: A | Discharge status: Home / Self Care | Payer: Medicare Other | Source: Ambulatory Visit | Attending: Family Medicine | Admitting: Family Medicine

## 2022-07-22 VITALS — BP 110/72 | HR 60 | Wt 220.0 lb

## 2022-07-22 DIAGNOSIS — R109 Unspecified abdominal pain: Secondary | ICD-10-CM | POA: Diagnosis not present

## 2022-07-22 DIAGNOSIS — G4733 Obstructive sleep apnea (adult) (pediatric): Secondary | ICD-10-CM | POA: Diagnosis not present

## 2022-07-22 DIAGNOSIS — R600 Localized edema: Secondary | ICD-10-CM | POA: Diagnosis not present

## 2022-07-22 DIAGNOSIS — R058 Other specified cough: Secondary | ICD-10-CM | POA: Diagnosis not present

## 2022-07-22 DIAGNOSIS — Q2543 Congenital aneurysm of aorta: Secondary | ICD-10-CM | POA: Diagnosis not present

## 2022-07-22 DIAGNOSIS — E785 Hyperlipidemia, unspecified: Secondary | ICD-10-CM | POA: Diagnosis not present

## 2022-07-22 DIAGNOSIS — I2510 Atherosclerotic heart disease of native coronary artery without angina pectoris: Secondary | ICD-10-CM | POA: Diagnosis not present

## 2022-07-22 DIAGNOSIS — Z955 Presence of coronary angioplasty implant and graft: Secondary | ICD-10-CM | POA: Insufficient documentation

## 2022-07-22 DIAGNOSIS — R011 Cardiac murmur, unspecified: Secondary | ICD-10-CM | POA: Diagnosis not present

## 2022-07-22 DIAGNOSIS — Z79899 Other long term (current) drug therapy: Secondary | ICD-10-CM | POA: Insufficient documentation

## 2022-07-22 DIAGNOSIS — I6529 Occlusion and stenosis of unspecified carotid artery: Secondary | ICD-10-CM | POA: Insufficient documentation

## 2022-07-22 DIAGNOSIS — R55 Syncope and collapse: Secondary | ICD-10-CM

## 2022-07-22 DIAGNOSIS — M7989 Other specified soft tissue disorders: Secondary | ICD-10-CM

## 2022-07-22 DIAGNOSIS — I872 Venous insufficiency (chronic) (peripheral): Secondary | ICD-10-CM | POA: Insufficient documentation

## 2022-07-22 DIAGNOSIS — I25118 Atherosclerotic heart disease of native coronary artery with other forms of angina pectoris: Secondary | ICD-10-CM | POA: Insufficient documentation

## 2022-07-22 DIAGNOSIS — I110 Hypertensive heart disease with heart failure: Secondary | ICD-10-CM | POA: Insufficient documentation

## 2022-07-22 DIAGNOSIS — I10 Essential (primary) hypertension: Secondary | ICD-10-CM

## 2022-07-22 DIAGNOSIS — I359 Nonrheumatic aortic valve disorder, unspecified: Secondary | ICD-10-CM

## 2022-07-22 DIAGNOSIS — I7121 Aneurysm of the ascending aorta, without rupture: Secondary | ICD-10-CM

## 2022-07-22 DIAGNOSIS — I6521 Occlusion and stenosis of right carotid artery: Secondary | ICD-10-CM

## 2022-07-22 NOTE — Patient Instructions (Addendum)
Thank you for coming in today  Your physician recommends that you schedule a follow-up appointment in:  2-3 months with Dr. Haroldine Laws You will receive a reminder letter in the mail a few months in advance. If you don't receive a letter, please call our office to schedule the follow-up appointment.   Your provider has recommended that  you wear a Zio Patch for 14 days.  This monitor will record your heart rhythm for our review.  IF you have any symptoms while wearing the monitor please press the button.  If you have any issues with the patch or you notice a red or orange light on it please call the company at [phone].  Once you remove the patch please mail it back to the company as soon as possible so we can get the results.   Do the following things EVERYDAY: Weigh yourself in the morning before breakfast. Write it down and keep it in a log. Take your medicines as prescribed Eat low salt foods--Limit salt (sodium) to 2000 mg per day.  Stay as active as you can everyday Limit all fluids for the day to less than 2 liters  At the [facility], you and your health needs are our priority. As part of our continuing mission to provide you with exceptional heart care, we have created designated Provider Care Teams. These Care Teams include your primary Cardiologist (physician) and Advanced Practice Providers (APPs- Physician Assistants and Nurse Practitioners) who all work together to provide you with the care you need, when you need it.   You may see any of the following providers on your designated Care Team at your next follow up: Dr. Glori Bickers Dr. Loralie Champagne Dr. Roxana Hires, NP Lyda Jester, PA Allena Katz, NP Marlyce Huge, [place] Forestine Na, NP Audry Riles, PharmD   Please be sure to bring in all your medications bottles to every appointment.    Thank you for choosing [facility]   If you  have any questions or concerns before your next appointment please send [place] a message through [place] or call our office at [phone].    TO LEAVE A MESSAGE FOR THE NURSE SELECT OPTION 2, PLEASE LEAVE A MESSAGE INCLUDING: YOUR NAME DATE OF BIRTH CALL BACK NUMBER REASON FOR CALL**this is important as we prioritize the call backs  YOU WILL RECEIVE A CALL BACK THE SAME DAY AS LONG AS YOU CALL BEFORE 4:00 PM

## 2022-07-24 ENCOUNTER — Institutional Professional Consult (permissible substitution): Payer: Medicare Other | Admitting: Pulmonary Disease

## 2022-08-05 ENCOUNTER — Ambulatory Visit (INDEPENDENT_AMBULATORY_CARE_PROVIDER_SITE_OTHER): Payer: Medicare Other | Admitting: Pulmonary Disease

## 2022-08-05 ENCOUNTER — Encounter: Payer: Self-pay | Admitting: Pulmonary Disease

## 2022-08-05 VITALS — BP 114/70 | HR 61 | Ht 71.0 in | Wt 215.0 lb

## 2022-08-05 DIAGNOSIS — R0602 Shortness of breath: Secondary | ICD-10-CM

## 2022-08-05 DIAGNOSIS — J42 Unspecified chronic bronchitis: Secondary | ICD-10-CM | POA: Diagnosis not present

## 2022-08-05 NOTE — Patient Instructions (Addendum)
We will order you a nebulizer machine and supplies  Start budesonide nebulizer treatment 0.69m/mL twice daily  Monitor for improvement in cough, wheezing and shortness of breath  We will request a copy of your CT scan results.   Follow up in 2 months

## 2022-08-05 NOTE — Progress Notes (Addendum)
Synopsis: Referred in February 2024 for pulmonary nodules by Margaretha Sheffield, MD  Subjective:   PATIENT ID: Chris Guzman GENDER: male DOB: 11-22-1933, MRN: [id number]  HPI  Chief Complaint  Patient presents with   Consult    Referred by PCP for increased SOB, per wife. Not aware of any pulmonary nodules.    Chris Guzman is an 89 year old male, never smoker with CAD s/p DES to LAD in 2010, congestive heart failure, HTN and HLD who is referred to pulmonary clinic for pulmonary nodules and shortness of breath.   He has chronic cough. He coughs for the first couple of hours of laying down for bed. Denies heartburn or reflux. He does have some wheezing with the cough. No sinus or nasal congestion and no post-nasal drainage. She does report he has had syncopal episodes recently which have been worked up by cardiology without significant etiology at this time. He has visit with neurology tomorrow. She reports one of the syncopal episodes was proceeded by a coughing fit, otherwise no significant cough associated with other events.   CT Chest 07/11/22 viewed in PACS, with no interstitial lung disease findings or opacities or infiltrates. Multiple small millimeter size nodules in lower lobes.   He is a never smoker. He had second hand smoke from his father. His mother died from long cancer. He worked for [employer]. He lives with his wife and she is his primary care taker.   Past Medical History:  Diagnosis Date   CAD (coronary artery disease)    aborted inf STEMI on treadmill 9/10; PCI of RCA, DES to LAD  with PTCA  of D2 9/10; relook cath 11/10-patent LAD stent with small branch vessel and dis RCA disease   Carotid artery occlusion    CHF (congestive heart failure) (HCC)    Elevated PSA    HTN (hypertension)    Hyperlipidemia    OSA (obstructive sleep apnea)    probable     Family History  Problem Relation Age of Onset   Pulmonary embolism Father    Heart attack Father 69   Lung cancer  Mother    Heart attack Sister      Social History   Socioeconomic History   Marital status: Married    Spouse name: Not on file   Number of children: Not on file   Years of education: Not on file   Highest education level: Not on file  Occupational History   Not on file  Tobacco Use   Smoking status: Never   Smokeless tobacco: Never  Vaping Use   Vaping Use: Never used  Substance and Sexual Activity   Alcohol use: No   Drug use: No   Sexual activity: Not on file  Other Topics Concern   Not on file  Social History Narrative   Not on file   Social Determinants of Health   Financial Resource Strain: Not on file  Food Insecurity: Not on file  Transportation Needs: Not on file  Physical Activity: Not on file  Stress: Not on file  Social Connections: Not on file  Intimate Partner Violence: Not on file     Allergies  Allergen Reactions   Ace Inhibitors Cough     Outpatient Medications Prior to Visit  Medication Sig Dispense Refill   amLODipine (NORVASC) 10 MG tablet Take 1 tablet (10 mg total) by mouth daily. 180 tablet 3   ammonium lactate (AMLACTIN) 12 % cream ammonium lactate 12 % topical cream  APPLY TO DRY SKIN ON LEGS ARMS AND BACK ONCE TO TWICE DAILY AFTER SHOWERS (AVOID FACE OPEN CUTS AND GROIN)     aspirin 81 MG tablet Take 81 mg by mouth daily.     Cholecalciferol (VITAMIN D) 1000 UNITS capsule Take 1,000 Units by mouth daily.     donepezil (ARICEPT) 10 MG tablet donepezil 10 mg tablet  Take 1 tablet every day by oral route.     fish oil-omega-3 fatty acids 1000 MG capsule Take 1 g by mouth 2 (two) times daily.     folic acid (FOLVITE) A999333 MCG tablet Take 800 mcg by mouth daily.     furosemide (LASIX) 20 MG tablet Take 20 mg by mouth daily as needed.     hydrocortisone 2.5 % ointment hydrocortisone 2.5 % topical ointment     losartan (COZAAR) 50 MG tablet Take 25 mg by mouth daily.     memantine (NAMENDA) 10 MG tablet Take 10 mg by mouth 2 (two) times  daily.     Multiple Vitamins-Minerals (CENTRUM SILVER PO) Take 1 capsule by mouth daily.     olmesartan (BENICAR) 20 MG tablet Take 10 mg by mouth daily. Take 1/2 of a '20mg'$  tablet once a day     pyridOXINE (VITAMIN B-6) 100 MG tablet Take 50 mg by mouth daily.     rosuvastatin (CRESTOR) 40 MG tablet Take 1 tablet by mouth once daily 90 tablet 3   triamcinolone cream (KENALOG) 0.1 % SMARTSIG:1 Application Topical 2-3 Times Daily     vitamin B-12 (CYANOCOBALAMIN) 250 MCG tablet Take 250 mcg by mouth daily.     No facility-administered medications prior to visit.   Review of Systems  Constitutional:  Negative for chills, fever, malaise/fatigue and weight loss.  HENT:  Negative for congestion, sinus pain and sore throat.   Eyes: Negative.   Respiratory:  Positive for cough and shortness of breath. Negative for hemoptysis, sputum production and wheezing.   Cardiovascular:  Negative for chest pain, palpitations, orthopnea, claudication and leg swelling.  Gastrointestinal:  Negative for abdominal pain, heartburn, nausea and vomiting.  Genitourinary: Negative.   Musculoskeletal:  Negative for joint pain and myalgias.  Skin:  Negative for rash.  Neurological:  Negative for weakness.  Endo/Heme/Allergies: Negative.   Psychiatric/Behavioral:  Positive for depression. The patient is nervous/anxious.    Objective:   Vitals:   08/05/22 1540  BP: 114/70  Pulse: 61  SpO2: 99%  Weight: 215 lb (97.5 kg)  Height: '5\' 11"'$  (1.803 m)    Physical Exam Constitutional:      General: He is not in acute distress. HENT:     Head: Normocephalic and atraumatic.  Eyes:     Extraocular Movements: Extraocular movements intact.     Conjunctiva/sclera: Conjunctivae normal.     Pupils: Pupils are equal, round, and reactive to light.  Cardiovascular:     Rate and Rhythm: Normal rate and regular rhythm.     Pulses: Normal pulses.     Heart sounds: Normal heart sounds. No murmur heard. Pulmonary:      Effort: Pulmonary effort is normal.     Breath sounds: Normal breath sounds.  Abdominal:     General: Bowel sounds are normal.     Palpations: Abdomen is soft.  Musculoskeletal:     Right lower leg: No edema.     Left lower leg: No edema.  Lymphadenopathy:     Cervical: No cervical adenopathy.  Skin:    General: Skin is warm  and dry.  Neurological:     General: No focal deficit present.     Mental Status: He is alert.  Psychiatric:        Mood and Affect: Mood normal.        Behavior: Behavior normal.        Thought Content: Thought content normal.        Judgment: Judgment normal.    CBC    Component Value Date/Time   WBC 7.8 06/21/2022 1524   RBC 4.15 (L) 06/21/2022 1524   HGB 13.6 06/21/2022 1524   HCT 41.0 06/21/2022 1524   PLT 137 (L) 06/21/2022 1524   MCV 98.8 06/21/2022 1524   MCH 32.8 06/21/2022 1524   MCHC 33.2 06/21/2022 1524   RDW 12.9 06/21/2022 1524   LYMPHSABS 1.2 05/01/2009 0210   MONOABS 0.7 05/01/2009 0210   EOSABS 0.1 05/01/2009 0210   BASOSABS 0.0 05/01/2009 0210      Latest Ref Rng & Units 06/21/2022    3:24 PM 04/15/2019   12:19 PM 03/06/2018   10:03 AM  BMP  Glucose 70 - 99 mg/dL 98  118  117   BUN 8 - 23 mg/dL '20  17  20   '$ Creatinine 0.61 - 1.24 mg/dL 1.40  1.29  1.32   Sodium 135 - 145 mmol/L 139  139  143   Potassium 3.5 - 5.1 mmol/L 4.3  3.8  4.2   Chloride 98 - 111 mmol/L 101  105  109   CO2 22 - 32 mmol/L '29  24  26   '$ Calcium 8.9 - 10.3 mg/dL 9.5  9.2  9.3    Chest imaging:  PFT:     No data to display          Labs:  Path:  Echo 08/2020: LV EF 60-65%. Grade I diastolic dysfunction. RV systolic function and size are normal. LA moderately dilated.    Heart Catheterization:  Assessment & Plan:   Chronic bronchitis, unspecified chronic bronchitis type (HCC)  Shortness of breath - Plan: CANCELED: Pulmonary Function Test  Discussion: Chris Guzman is an 89 year old male, never smoker with dementia, CAD s/p DES to  LAD in 2010, congestive heart failure, HTN and HLD who is referred to pulmonary clinic for pulmonary nodules and shortness of breath.   We will request a copy of the radiology report from [facility] for the recent CT Chest scan. The pulmonary nodules are likely of benign etiology.   We will try him on budesonide nebulizer treatments twice daily for his cough, shortness of breath and intermittent wheezing. Inhaler therapy may be too difficult with his dementia.   We will consider spirometry at his follow up visit in 2 months.  Freda Jackson, MD [facility] Office: [phone]     Current Outpatient Medications:    amLODipine (NORVASC) 10 MG tablet, Take 1 tablet (10 mg total) by mouth daily., Disp: 180 tablet, Rfl: 3   ammonium lactate (AMLACTIN) 12 % cream, ammonium lactate 12 % topical cream  APPLY TO DRY SKIN ON LEGS ARMS AND BACK ONCE TO TWICE DAILY AFTER SHOWERS (AVOID FACE OPEN CUTS AND GROIN), Disp: , Rfl:    aspirin 81 MG tablet, Take 81 mg by mouth daily., Disp: , Rfl:    Cholecalciferol (VITAMIN D) 1000 UNITS capsule, Take 1,000 Units by mouth daily., Disp: , Rfl:    donepezil (ARICEPT) 10 MG tablet, donepezil 10 mg tablet  Take 1 tablet every day  by oral route., Disp: , Rfl:    fish oil-omega-3 fatty acids 1000 MG capsule, Take 1 g by mouth 2 (two) times daily., Disp: , Rfl:    folic acid (FOLVITE) A999333 MCG tablet, Take 800 mcg by mouth daily., Disp: , Rfl:    furosemide (LASIX) 20 MG tablet, Take 20 mg by mouth daily as needed., Disp: , Rfl:    hydrocortisone 2.5 % ointment, hydrocortisone 2.5 % topical ointment, Disp: , Rfl:    losartan (COZAAR) 50 MG tablet, Take 25 mg by mouth daily., Disp: , Rfl:    memantine (NAMENDA) 10 MG tablet, Take 10 mg by mouth 2 (two) times daily., Disp: , Rfl:    Multiple Vitamins-Minerals (CENTRUM SILVER PO), Take 1 capsule by mouth daily., Disp: , Rfl:    olmesartan (BENICAR) 20 MG tablet, Take 10 mg by mouth daily. Take  1/2 of a '20mg'$  tablet once a day, Disp: , Rfl:    pyridOXINE (VITAMIN B-6) 100 MG tablet, Take 50 mg by mouth daily., Disp: , Rfl:    rosuvastatin (CRESTOR) 40 MG tablet, Take 1 tablet by mouth once daily, Disp: 90 tablet, Rfl: 3   triamcinolone cream (KENALOG) 0.1 %, SMARTSIG:1 Application Topical 2-3 Times Daily, Disp: , Rfl:    vitamin B-12 (CYANOCOBALAMIN) 250 MCG tablet, Take 250 mcg by mouth daily., Disp: , Rfl:    budesonide (PULMICORT) 0.5 MG/2ML nebulizer solution, Take 2 mLs (0.5 mg total) by nebulization in the morning and at bedtime., Disp: 120 mL, Rfl: 12

## 2022-08-07 ENCOUNTER — Ambulatory Visit: Payer: Medicare Other | Admitting: Neurology

## 2022-08-08 ENCOUNTER — Telehealth: Payer: Self-pay | Admitting: Pulmonary Disease

## 2022-08-08 NOTE — Telephone Encounter (Signed)
Patient is calling to check the status of the medical supplies and nebulizer machine that the doctor was supposed to order at her last visit.  Patient stated the doctor told her it would be deliver at the hospital and patient is not sure of which hospital or where she should pick it up.  Please advise and call patient to discuss.  CB# [phone] or [phone] (Home)

## 2022-08-09 ENCOUNTER — Telehealth: Payer: Self-pay | Admitting: Pulmonary Disease

## 2022-08-09 DIAGNOSIS — R0602 Shortness of breath: Secondary | ICD-10-CM

## 2022-08-09 NOTE — Telephone Encounter (Signed)
Per Cherina- order was sent to [facility] for neb med and neb machine and supplies.  I spoke with the pt's spouse and provided her with their contact information. Nothing further needed.

## 2022-08-12 NOTE — Telephone Encounter (Signed)
Called and spoke with patient's wife Patsy. She is aware that we have faxed the documents to [facility] several times and that they are having issues with their fax. I advised her that I would continue to fax the documents until I received a confirmation. She verbalized understanding and appreciation.   Nothing further needed at time of call.

## 2022-08-13 MED ORDER — BUDESONIDE 0.5 MG/2ML IN SUSP: 0.5000 mg | Freq: Two times a day (BID) | RESPIRATORY_TRACT | 12 refills | Status: DC

## 2022-08-13 NOTE — Telephone Encounter (Signed)
[facility] is calling to speak to nurse regarding patient's medication.  Please return call at [phone]

## 2022-08-13 NOTE — Addendum Note (Signed)
Addended by: Gavin Potters R on: 08/13/2022 11:51 AM   Modules accepted: Orders

## 2022-08-13 NOTE — Telephone Encounter (Signed)
Spoke with [facility] who are requesting Rx resent for budesonide neb as well as nebulizer machine and supplies. Orders for budesonide neb and machine were electronically sent to [facility] and pt insurance as well as last OV notes were faxed to [facility] per [facility] request. Nothing further needed a this time.

## 2022-08-13 NOTE — Telephone Encounter (Signed)
Order for budesonide  and neb machine and supplies had to be printed and signed and faxed to [facility] which was done. Nothing further needed at this time.

## 2022-08-13 NOTE — Addendum Note (Signed)
Addended by: Gavin Potters R on: 08/13/2022 11:43 AM   Modules accepted: Orders

## 2022-08-13 NOTE — Addendum Note (Signed)
Encounter addended by: Micki Riley, RN on: 08/13/2022 12:40 PM  Actions taken: Imaging Exam ended

## 2022-08-14 ENCOUNTER — Ambulatory Visit (HOSPITAL_COMMUNITY)
Admission: RE | Admit: 2022-08-14 | Discharge: 2022-08-14 | Disposition: A | Discharge status: Home / Self Care | Payer: Medicare Other | Source: Ambulatory Visit | Attending: Internal Medicine | Admitting: Internal Medicine

## 2022-08-14 ENCOUNTER — Other Ambulatory Visit: Payer: Self-pay | Admitting: *Deleted

## 2022-08-14 DIAGNOSIS — E785 Hyperlipidemia, unspecified: Secondary | ICD-10-CM | POA: Insufficient documentation

## 2022-08-14 DIAGNOSIS — I509 Heart failure, unspecified: Secondary | ICD-10-CM | POA: Diagnosis present

## 2022-08-14 DIAGNOSIS — I082 Rheumatic disorders of both aortic and tricuspid valves: Secondary | ICD-10-CM | POA: Diagnosis not present

## 2022-08-14 DIAGNOSIS — Z955 Presence of coronary angioplasty implant and graft: Secondary | ICD-10-CM | POA: Diagnosis not present

## 2022-08-14 DIAGNOSIS — I110 Hypertensive heart disease with heart failure: Secondary | ICD-10-CM | POA: Insufficient documentation

## 2022-08-14 DIAGNOSIS — R55 Syncope and collapse: Secondary | ICD-10-CM | POA: Insufficient documentation

## 2022-08-14 DIAGNOSIS — I2510 Atherosclerotic heart disease of native coronary artery without angina pectoris: Secondary | ICD-10-CM | POA: Diagnosis not present

## 2022-08-14 LAB — ECHOCARDIOGRAM COMPLETE
AR max vel: 2.13 cm2
AV Area VTI: 1.94 cm2
AV Area mean vel: 2.21 cm2
AV Mean grad: 9 mmHg
AV Peak grad: 18.5 mmHg
Ao pk vel: 2.15 m/s
Area-P 1/2: 3.37 cm2
S' Lateral: 2.9 cm

## 2022-08-14 MED ORDER — BUDESONIDE 0.5 MG/2ML IN SUSP: 0.5000 mg | Freq: Two times a day (BID) | RESPIRATORY_TRACT | 12 refills | Status: DC

## 2022-08-14 NOTE — Telephone Encounter (Addendum)
Spoke with [facility] about the Rx that was sent for pt's budesonide suspension. The quantity needs to be changed to 173ms.  We need to have a billable diagnosis code added on the Rx in the note to pharmacy as this is required for Medicare.   Chronic cough which is diagnosis code R05.3 will not work as it needs to be a J-code, whether it be a code for chronic bronchitis which is J42 or a different diagnosis that might work.  Dr. DErin Fulling please advise on this so we can get the Rx fixed for pt.

## 2022-08-14 NOTE — Addendum Note (Signed)
Addended by: Lorretta Harp on: 08/14/2022 01:26 PM   Modules accepted: Orders

## 2022-08-14 NOTE — Telephone Encounter (Signed)
Rx sent to [facility] and placed Dx: [id number] on the Rx.

## 2022-08-14 NOTE — Telephone Encounter (Signed)
Ok to use chronic bronchitis.  Thanks, JD

## 2022-08-14 NOTE — Addendum Note (Signed)
Addended by: Lorretta Harp on: 08/14/2022 01:21 PM   Modules accepted: Orders

## 2022-08-19 ENCOUNTER — Other Ambulatory Visit: Payer: Self-pay

## 2022-08-19 MED ORDER — BUDESONIDE 0.5 MG/2ML IN SUSP: 0.5000 mg | Freq: Two times a day (BID) | RESPIRATORY_TRACT | 12 refills | Status: DC

## 2022-08-20 ENCOUNTER — Telehealth: Payer: Self-pay | Admitting: Pulmonary Disease

## 2022-08-20 NOTE — Telephone Encounter (Signed)
Note updated with correct diagnosis/code.  Thanks, JD

## 2022-08-20 NOTE — Telephone Encounter (Signed)
Paediatric nurse. (Not [facility] as noted)  They need diag code to fill RX that was sent. Please resend w/proper code like  Asthma, COPD, etc.   The diag on the AVS are not covered under his insurance, she said.  Wife has been calling them continually so please call ASAP. Thanks.    Mariella Saa is her name  Her # [phone]

## 2022-08-20 NOTE — Telephone Encounter (Signed)
Dr. Erin Fulling, can you do an addendum to last OV and add diagnosis code J68 which would be for chronic bronchitis. That way [facility] can be able to get this taken care of for pt

## 2022-08-21 NOTE — Telephone Encounter (Signed)
Updated OV notes have been faxed to [facility]

## 2022-08-22 ENCOUNTER — Telehealth: Payer: Self-pay | Admitting: Pulmonary Disease

## 2022-08-22 DIAGNOSIS — J42 Unspecified chronic bronchitis: Secondary | ICD-10-CM

## 2022-08-22 NOTE — Telephone Encounter (Signed)
Chris Guzman states needs order for Nebulizer machine. [facility] phone number is [phone]. Fax number is [phone].

## 2022-08-23 NOTE — Telephone Encounter (Signed)
Order for nebulizer has been placed for [place].

## 2022-08-27 ENCOUNTER — Telehealth: Payer: Self-pay | Admitting: Pulmonary Disease

## 2022-08-27 DIAGNOSIS — J42 Unspecified chronic bronchitis: Secondary | ICD-10-CM

## 2022-08-27 NOTE — Telephone Encounter (Signed)
Requesting a prescription be sent for nebulizer

## 2022-08-28 NOTE — Telephone Encounter (Signed)
Called and spoke with patients wife. She stated that there has been a lot of back and forth with [facility] with the order. Called and spoke with [facility] and they stated they needed a prescription with the providers signature on it to be sent to them. Advised patients wife I would get the fax sent over to [facility]. Will have Dr. Erin Fulling sign when he comes back to the office. Giving print out to cherina.   Nothing further needed.

## 2022-09-05 ENCOUNTER — Encounter: Payer: Self-pay | Admitting: Internal Medicine

## 2022-09-05 ENCOUNTER — Ambulatory Visit: Payer: Medicare Other | Attending: Internal Medicine | Admitting: Internal Medicine

## 2022-09-05 VITALS — BP 132/74 | HR 61 | Wt 221.4 lb

## 2022-09-05 DIAGNOSIS — I359 Nonrheumatic aortic valve disorder, unspecified: Secondary | ICD-10-CM

## 2022-09-05 DIAGNOSIS — I7121 Aneurysm of the ascending aorta, without rupture: Secondary | ICD-10-CM | POA: Diagnosis not present

## 2022-09-05 DIAGNOSIS — I2510 Atherosclerotic heart disease of native coronary artery without angina pectoris: Secondary | ICD-10-CM | POA: Diagnosis not present

## 2022-09-05 DIAGNOSIS — R55 Syncope and collapse: Secondary | ICD-10-CM | POA: Diagnosis not present

## 2022-09-05 DIAGNOSIS — Q2543 Congenital aneurysm of aorta: Secondary | ICD-10-CM

## 2022-09-05 NOTE — Progress Notes (Signed)
[facility] Note  Date:  09/05/2022   ID:  Chris Guzman, DOB Nov 14, 1933, MRN [id number]  Location: Home  Provider location: [address], [place] Type of Visit: Established  PCP:  Guzman, Chris Anderson, MD  HF Cardiologist: Dr. Haroldine Guzman  Chief Complaint: cardiac follow up   HPI: Chris Guzman is a 89 y.o. male with HTN, OSA. CAD s/p DES to LAD and RCA + PTCA diagonal in Sept 2010. In November 2010 had relook cath showing patent stent and small vessel dz, not favorable for PCI.   Carotid dopplers (3/13): Minimal disease.  Echo (10/15): EF 60-65%, mild dilation of aortic root and ascending aorta (4.2/4.0 cm), normal RV size and systolic function, aortic sclerosis without stenosis.    Myoview 6/17: Nuclear stress EF: 55%. There was no ST segment deviation noted during stress. There is a small defect of mild severity present in the basal inferior and mid inferior location. The defect is partially-reversible. This is most consistent with variations in diaphragmatic attenuation. This is a low risk study. The left ventricular ejection fraction is normal (55-65%).   Echo 11/17: EF 65-70%. Grade I DD. Aortic sclerosis without stenosis. 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 Mild aortic sclerosis AoRoot stable at 4cm   Echo 3/22: EF 60-65% RV normal. Mild aortic sclerosis AoRoot stable at 4.1cm    Follow up 3/23, mild SOB with activity. Struggling with memory. LE swelling from venous stasis.  Seen in ED 07/22/21 after wife noticed several episodes of him staring and unresponsive, had difficulty arousing him. Patient had no recollection of episodes. CT head negative for acute findings. Hs Troponin negative x 2. Recommended expedited neurology follow up for ? Seizure.  Saw Dr. Trula Guzman 07/05/22, did not feel extracranial carotid or vertebral disease contributing to syncopal episodes. Repeat dopplers  stable with RICA 60-70%  Echo 2/24 EF 60-65% mild AS mean gradient 9   Here for routine f/u with his wife and daughter. He is unable to give much history with his dementia but wife and daughter fill in. Over Christmas was having cough syncope. This has resolved. Now feels much better. No further syncope. No further staring spells. Has appt with Neurology next week. Seen by Pulmonary (Dr. Erin Guzman) started a nebulizer for wheezing. Also snores heavily. We tried to do HST but he couldn't complete.  No CP, SOB. Edema well controlled.    Past Medical History:  Diagnosis Date   CAD (coronary artery disease)    aborted inf STEMI on treadmill 9/10; PCI of RCA, DES to LAD  with PTCA  of D2 9/10; relook cath 11/10-patent LAD stent with small branch vessel and dis RCA disease   Carotid artery occlusion    CHF (congestive heart failure) (HCC)    Elevated PSA    HTN (hypertension)    Hyperlipidemia    OSA (obstructive sleep apnea)    probable   No past surgical history on file.   Current Outpatient Medications  Medication Sig Dispense Refill   amLODipine (NORVASC) 10 MG tablet Take 1 tablet (10 mg total) by mouth daily. 180 tablet 3   ammonium lactate (AMLACTIN) 12 % cream ammonium lactate 12 % topical cream  APPLY TO DRY SKIN ON LEGS ARMS AND BACK ONCE TO TWICE DAILY AFTER SHOWERS (AVOID FACE OPEN CUTS AND GROIN)     aspirin 81 MG tablet Take 81 mg by mouth daily.  Cholecalciferol (VITAMIN D) 1000 UNITS capsule Take 1,000 Units by mouth daily.     donepezil (ARICEPT) 10 MG tablet donepezil 10 mg tablet  Take 1 tablet every day by oral route.     fish oil-omega-3 fatty acids 1000 MG capsule Take 1 g by mouth 2 (two) times daily.     folic acid (FOLVITE) A999333 MCG tablet Take 800 mcg by mouth daily.     furosemide (LASIX) 20 MG tablet Take 20 mg by mouth daily as needed.     hydrocortisone 2.5 % ointment hydrocortisone 2.5 % topical ointment     losartan (COZAAR) 50 MG tablet Take 25 mg by mouth  daily.     memantine (NAMENDA) 10 MG tablet Take 10 mg by mouth 2 (two) times daily.     Multiple Vitamins-Minerals (CENTRUM SILVER PO) Take 1 capsule by mouth daily.     olmesartan (BENICAR) 20 MG tablet Take 10 mg by mouth daily. Take 1/2 of a '20mg'$  tablet once a day     pyridOXINE (VITAMIN B-6) 100 MG tablet Take 50 mg by mouth daily.     rosuvastatin (CRESTOR) 40 MG tablet Take 1 tablet by mouth once daily 90 tablet 3   triamcinolone cream (KENALOG) 0.1 % SMARTSIG:1 Application Topical 2-3 Times Daily     vitamin B-12 (CYANOCOBALAMIN) 250 MCG tablet Take 250 mcg by mouth daily.     budesonide (PULMICORT) 0.5 MG/2ML nebulizer solution Take 2 mLs (0.5 mg total) by nebulization in the morning and at bedtime. 120 mL 12   No current facility-administered medications for this visit.    Allergies:   Ace inhibitors   Social History:  The patient  reports that he has never smoked. He has never used smokeless tobacco. He reports that he does not drink alcohol and does not use drugs.   Family History:  The patient's family history includes Heart attack in his sister; Heart attack (age of onset: 81) in his father; Lung cancer in his mother; Pulmonary embolism in his father.   ROS:  Please see the history of present illness.   All other systems are personally reviewed and negative.   Recent Labs: 06/21/2022: BUN 20; Creatinine, Ser 1.40; Hemoglobin 13.6; Platelets 137; Potassium 4.3; Sodium 139  Personally reviewed   Wt Readings from Last 3 Encounters:  09/05/22 221 lb 6.4 oz (100.4 kg)  08/05/22 215 lb (97.5 kg)  07/22/22 220 lb (99.8 kg)   BP 132/74   Pulse 61   Wt 221 lb 6.4 oz (100.4 kg)   SpO2 97%   BMI 30.88 kg/m   Physical Exam:   General:  Well appearing. No resp difficulty HEENT: normal Neck: supple. JVP 5-6. Carotids 2+ bilat; no bruits. No lymphadenopathy or thryomegaly appreciated. Cor: PMI nondisplaced. Regular rate & rhythm.2/6 SEM Lungs: clear Abdomen: soft, nontender,  nondistended. No hepatosplenomegaly. No bruits or masses. Good bowel sounds. Extremities: no cyanosis, clubbing, rash, 1+ edema R>L Neuro: alert  cranial nerves grossly intact. moves all 4 extremities w/o difficulty. Affect pleasant   ReDS 35%  Assessment & Plan  Syncope - Unclear etiology. [place] with 7 runs brief SVT - Felt to be cough syncope -> resolved - He has Neurology appt in 2 weeks.  - Echo stable  2.  CAD - s/p DES to LAD in 2010, also with PTCA to RCA and diagonal in 02/2009 - Nuclear study in 11/2015 nonischemic.  - He notes that his anginal equivalent is stomach pain. No recent  s/s angina - Continue ASA/statin.  3.  HTN  - Blood pressure well controlled, no low home BPs per wife. - Continue current regimen.   4.  Hyperlipidemia  - Goal LDL < 70.  - Continue rosuvastatin - Followed by PCP.   5.  Carotid stenosis - RICA (1/24) 60-70% followed by Dr. Trula Guzman with [facility].  6.  Aortic area murmur  - Echo 11/20 with AoV sclerosis without stenosis.  - Stable on echo 3/22.  - Echo 2/24 Mild AS    7.  Aortic root aneurysm  - Chest CT 02/2018 with aortic root 4.1 Stable - Echo 11/20 Ao Root stable at 4.0cm - Echo 3/22: EF 60-65% RV normal. Mild aortic sclerosis AoRoot stable at 4.1cm  - Stable on echo 2/24    8. LE edema - due to venous stasis.  - U/s with [facility] -> moderate great saphenous reflux bilaterally. - Continue support stockings and PRN lasix. - ReDS 35% -> volume status well controlled   Signed, Glori Bickers, MD  09/05/2022 12:21 PM  [facility] [place] [address] [phone] (office) [phone] (fax)

## 2022-09-05 NOTE — Patient Instructions (Signed)
Follow up in 6 months We will call you to schedule your appointment   Do the following things EVERYDAY: Weigh yourself in the morning before breakfast. Write it down and keep it in a log. Take your medicines as prescribed Eat low salt foods--Limit salt (sodium) to 2000 mg per day.  Stay as active as you can everyday Limit all fluids for the day to less than 2 liters

## 2022-09-10 ENCOUNTER — Telehealth: Payer: Self-pay | Admitting: Neurology

## 2022-09-10 ENCOUNTER — Encounter: Payer: Self-pay | Admitting: Neurology

## 2022-09-10 ENCOUNTER — Ambulatory Visit (INDEPENDENT_AMBULATORY_CARE_PROVIDER_SITE_OTHER): Payer: Medicare Other | Admitting: Neurology

## 2022-09-10 VITALS — BP 96/61 | HR 67 | Ht 68.6 in | Wt 217.5 lb

## 2022-09-10 DIAGNOSIS — R55 Syncope and collapse: Secondary | ICD-10-CM

## 2022-09-10 DIAGNOSIS — F02B0 Dementia in other diseases classified elsewhere, moderate, without behavioral disturbance, psychotic disturbance, mood disturbance, and anxiety: Secondary | ICD-10-CM

## 2022-09-10 DIAGNOSIS — G301 Alzheimer's disease with late onset: Secondary | ICD-10-CM

## 2022-09-10 DIAGNOSIS — R054 Cough syncope: Secondary | ICD-10-CM

## 2022-09-10 NOTE — Telephone Encounter (Signed)
[facility] is going to take this patient.

## 2022-09-10 NOTE — Telephone Encounter (Signed)
Pt daughter called stated she have some question for nurse. Stated mother brought pt to appointment today and she just have some questions.

## 2022-09-10 NOTE — Patient Instructions (Signed)
Continue your current medications Referral to home health services for dementia Routine EEG Follow-up in 6 months or sooner if worse.

## 2022-09-10 NOTE — Progress Notes (Signed)
[facility]  PATIENT: Chris Guzman DOB: Jan 05, 1934  REQUESTING CLINICIAN: Pomposini, Cherly Anderson, MD HISTORY FROM: Spouse and chart review  REASON FOR VISIT: Syncope   HISTORICAL  CHIEF COMPLAINT:  Chief Complaint  Patient presents with   New Patient (Initial Visit)    Rm 12. Accompanied by wife. NP Paper referral for Syncopal episodes, hosp f/u / Pricilla Holm Earp FNP-BC [facility].    HISTORY OF PRESENT ILLNESS:  This is a 89 year old gentleman past medical history of hypertension, hyperlipidemia, CHF, CAD, dementia and sleep apnea who is presenting with multiple syncopal episodes.  History mainly obtained from wife and chart review as patient has dementia.  Wife reports that patient has been having syncopal episodes since last summer.  These syncope are for the most part preceded by laugh or cough.  He will have these episodes while sitting down.  Wife reports the first episode happened last summer. Patient  was sitting down, having lunch, did not cough prior and became unresponsive. He started leaning to one side, then fell, his foot got caught of the bottom of the high chair causing him to break it. He also hit his head.  But for the rest of the episodes again wife reported these are preceded by laughing or coughing.  These episodes last only a few second but sometimes can be long up to 15 seconds. No abnormal movements reported. Patient is not aware of the episodes. Patient does have chronic cough.  He has seen cardiology, had extensive workup and no etiology was found.  He denies any previous history of seizures.    OTHER MEDICAL CONDITIONS: Hypertension, hyperlipidemia CHF, CAD, Dementia, sleep apnea    REVIEW OF SYSTEMS: Full 14 system review of systems performed and negative with exception of: As noted in the HPI   ALLERGIES: Allergies  Allergen Reactions   Ace Inhibitors Cough    HOME MEDICATIONS: Outpatient Medications Prior to Visit  Medication Sig  Dispense Refill   amLODipine (NORVASC) 10 MG tablet Take 1 tablet (10 mg total) by mouth daily. 180 tablet 3   ammonium lactate (AMLACTIN) 12 % cream ammonium lactate 12 % topical cream  APPLY TO DRY SKIN ON LEGS ARMS AND BACK ONCE TO TWICE DAILY AFTER SHOWERS (AVOID FACE OPEN CUTS AND GROIN)     aspirin 81 MG tablet Take 81 mg by mouth daily.     budesonide (PULMICORT) 0.5 MG/2ML nebulizer solution Take 2 mLs (0.5 mg total) by nebulization in the morning and at bedtime. 120 mL 12   Cholecalciferol (VITAMIN D) 1000 UNITS capsule Take 1,000 Units by mouth daily.     donepezil (ARICEPT) 10 MG tablet donepezil 10 mg tablet  Take 1 tablet every day by oral route.     fish oil-omega-3 fatty acids 1000 MG capsule Take 1 g by mouth 2 (two) times daily.     folic acid (FOLVITE) A999333 MCG tablet Take 800 mcg by mouth daily.     furosemide (LASIX) 20 MG tablet Take 20 mg by mouth daily as needed.     hydrocortisone 2.5 % ointment hydrocortisone 2.5 % topical ointment     losartan (COZAAR) 50 MG tablet Take 25 mg by mouth daily.     memantine (NAMENDA) 10 MG tablet Take 10 mg by mouth 2 (two) times daily.     Multiple Vitamins-Minerals (CENTRUM SILVER PO) Take 1 capsule by mouth daily.     olmesartan (BENICAR) 20 MG tablet Take 10 mg by mouth daily. Take  1/2 of a 20mg  tablet once a day     pyridOXINE (VITAMIN B-6) 100 MG tablet Take 50 mg by mouth daily.     rosuvastatin (CRESTOR) 40 MG tablet Take 1 tablet by mouth once daily 90 tablet 3   triamcinolone cream (KENALOG) 0.1 % SMARTSIG:1 Application Topical 2-3 Times Daily     vitamin B-12 (CYANOCOBALAMIN) 250 MCG tablet Take 250 mcg by mouth daily.     No facility-administered medications prior to visit.    PAST MEDICAL HISTORY: Past Medical History:  Diagnosis Date   CAD (coronary artery disease)    aborted inf STEMI on treadmill 9/10; PCI of RCA, DES to LAD  with PTCA  of D2 9/10; relook cath 11/10-patent LAD stent with small branch vessel and dis  RCA disease   Carotid artery occlusion    CHF (congestive heart failure) (HCC)    Elevated PSA    HTN (hypertension)    Hyperlipidemia    OSA (obstructive sleep apnea)    probable    PAST SURGICAL HISTORY: History reviewed. No pertinent surgical history.  FAMILY HISTORY: Family History  Problem Relation Age of Onset   Pulmonary embolism Father    Heart attack Father 18   Lung cancer Mother    Heart attack Sister     SOCIAL HISTORY: Social History   Socioeconomic History   Marital status: Married    Spouse name: Not on file   Number of children: Not on file   Years of education: Not on file   Highest education level: Not on file  Occupational History   Not on file  Tobacco Use   Smoking status: Never   Smokeless tobacco: Never  Vaping Use   Vaping Use: Never used  Substance and Sexual Activity   Alcohol use: No   Drug use: No   Sexual activity: Not on file  Other Topics Concern   Not on file  Social History Narrative   Not on file   Social Determinants of Health   Financial Resource Strain: Not on file  Food Insecurity: Not on file  Transportation Needs: Not on file  Physical Activity: Not on file  Stress: Not on file  Social Connections: Not on file  Intimate Partner Violence: Not on file     PHYSICAL EXAM  GENERAL EXAM/CONSTITUTIONAL: Vitals:  Vitals:   09/10/22 0831 09/10/22 0835  BP: 110/66 96/61  Pulse: 60 67  Weight: 217 lb 8 oz (98.7 kg)   Height: 5' 8.6" (1.742 m)    Body mass index is 32.49 kg/m. Wt Readings from Last 3 Encounters:  09/10/22 217 lb 8 oz (98.7 kg)  09/05/22 221 lb 6.4 oz (100.4 kg)  08/05/22 215 lb (97.5 kg)   Patient is in no distress; well developed, nourished and groomed; neck is supple  EYES: Visual fields full to confrontation, Extraocular movements intacts,   MUSCULOSKELETAL: Gait, strength, tone, movements noted in Neurologic exam below  NEUROLOGIC: MENTAL STATUS:      No data to display          awake, alert, oriented to person only not time.   Unable to provide history due to dementia Able to count the number of quarters in $1 but not $2.  Unable to state the days of the week backward.   CRANIAL NERVE:  2nd, 3rd, 4th, 6th - Visual fields full to confrontation, extraocular muscles intact, no nystagmus 5th - facial sensation symmetric 7th - facial strength symmetric 8th - hearing intact 9th -  palate elevates symmetrically, uvula midline 11th - shoulder shrug symmetric 12th - tongue protrusion midline  MOTOR:  normal bulk and tone, full strength in the BUE, BLE  SENSORY:  normal and symmetric to light touch  COORDINATION:  finger-nose-finger, fine finger movements normal  GAIT/STATION:  Wide based and unsteady     DIAGNOSTIC DATA (LABS, IMAGING, TESTING) - I reviewed patient records, labs, notes, testing and imaging myself where available.  Lab Results  Component Value Date   WBC 7.8 06/21/2022   HGB 13.6 06/21/2022   HCT 41.0 06/21/2022   MCV 98.8 06/21/2022   PLT 137 (L) 06/21/2022      Component Value Date/Time   NA 139 06/21/2022 1524   K 4.3 06/21/2022 1524   CL 101 06/21/2022 1524   CO2 29 06/21/2022 1524   GLUCOSE 98 06/21/2022 1524   BUN 20 06/21/2022 1524   CREATININE 1.40 (H) 06/21/2022 1524   CALCIUM 9.5 06/21/2022 1524   PROT 6.7 04/15/2019 1219   ALBUMIN 4.1 04/15/2019 1219   AST 27 04/15/2019 1219   ALT 23 04/15/2019 1219   ALKPHOS 43 04/15/2019 1219   BILITOT 1.3 (H) 04/15/2019 1219   GFRNONAA 49 (L) 06/21/2022 1524   GFRAA 59 (L) 04/15/2019 1219   Lab Results  Component Value Date   CHOL 124 04/15/2019   HDL 58 04/15/2019   LDLCALC 55 04/15/2019   TRIG 55 04/15/2019   CHOLHDL 2.1 04/15/2019   Lab Results  Component Value Date   HGBA1C  03/21/2009    5.4 (NOTE) The ADA recommends the following therapeutic goal for glycemic control related to Hgb A1c measurement: Goal of therapy: <6.5 Hgb A1c  Reference: American Diabetes  Association: Clinical Practice Recommendations 2010, Diabetes Care, 2010, 33: (Suppl  1).   Lab Results  Component Value Date   [id number] 03/21/2009   Lab Results  Component Value Date   TSH 1.397 03/14/2017    CT Head 06/21/2022 Chronic atrophic and ischemic changes without acute abnormality.     ASSESSMENT AND PLAN  89 y.o. year old male with past medical history including CAD, CHF, hypertension, hyperlipidemia, dementia and sleep apnea who is presenting with syncope that are preceded by coughing or laughter.  Patient likely has cough syncope, but will obtain EEG to rule out seizures.   When he comes to the dementia.  Patient is already on Namenda and Aricept.  Wife reported sometimes, it is difficult to take care of patient at home.  I will send a referral to home health services.  At his follow up visit in 6 months we will focus more on the memory.  They voiced understanding.  Return sooner if worse.   1. Cough syncope   2. Moderate late onset Alzheimer's dementia without behavioral disturbance, psychotic disturbance, mood disturbance, or anxiety [facility])      Patient Instructions  Continue your current medications Referral to home health services for dementia Routine EEG Follow-up in 6 months or sooner if worse.  Orders Placed This Encounter  Procedures   Ambulatory referral to [facility]   EEG adult    No orders of the defined types were placed in this encounter.   Return in about 6 months (around 03/13/2023).    Alric Ran, MD 09/10/2022, 10:46 AM  [facility] [address] [place], [place] [address] [phone]

## 2022-09-11 NOTE — Telephone Encounter (Signed)
Called and LVM rq CB 

## 2022-09-24 ENCOUNTER — Ambulatory Visit (INDEPENDENT_AMBULATORY_CARE_PROVIDER_SITE_OTHER): Payer: Medicare Other | Admitting: Neurology

## 2022-09-24 DIAGNOSIS — R55 Syncope and collapse: Secondary | ICD-10-CM | POA: Diagnosis not present

## 2022-09-24 NOTE — Progress Notes (Signed)
Please call and inform patient that his recent EEG (Brain wave test) was normal. In particular, there were no epileptiform discharges and no seizures. No further action is required on this test at this time. Please keep any upcoming appointments or tests and  call us with any interim questions, concerns, problems or updates. Thanks,   Ethell Blatchford, MD 

## 2022-09-24 NOTE — Procedures (Signed)
    History:  89 year old man with cough syncope   EEG classification: Awake and drowsy  Description of the recording: The background rhythms of this recording consists of a fairly well modulated low amplitude activity that is reactive to eye opening and closure with no clear PDR. Present in the anterior head region is a 15-20 Hz beta activity. Photic stimulation was performed, did not show any abnormalities. Hyperventilation was also performed, did not show any abnormalities. Drowsiness was manifested by background fragmentation. No abnormal epileptiform discharges seen during this recording. There was no focal slowing. There were no electrographic seizure identified.   Abnormality: None   Impression: This is essentially a normal EEG recorded while drowsy and awake. No evidence of interictal epileptiform discharges. Normal EEGs, however, do not rule out epilepsy.    Alric Ran, MD [facility]

## 2022-09-25 ENCOUNTER — Telehealth: Payer: Self-pay | Admitting: Neurology

## 2022-09-25 NOTE — Telephone Encounter (Signed)
Pt wife called requesting EEG results.

## 2022-09-26 NOTE — Telephone Encounter (Signed)
Unable to leave msg voicemail full 2nd attempt

## 2022-10-07 ENCOUNTER — Telehealth: Payer: Self-pay | Admitting: Neurology

## 2022-10-07 NOTE — Telephone Encounter (Signed)
Chris Guzman is calling from [facility]. Asking for an order to discharge social worker HH.

## 2022-10-09 NOTE — Telephone Encounter (Signed)
Called and spoke to [place] and she states she will get [facility] to fax over forms for the discharge.

## 2022-10-09 NOTE — Telephone Encounter (Signed)
Ok to discharge. I think we have to send order form to them.

## 2022-10-10 NOTE — Telephone Encounter (Signed)
Yes.  Thank you.

## 2022-11-01 ENCOUNTER — Other Ambulatory Visit (HOSPITAL_COMMUNITY): Payer: Self-pay | Admitting: Internal Medicine

## 2022-11-15 ENCOUNTER — Ambulatory Visit (INDEPENDENT_AMBULATORY_CARE_PROVIDER_SITE_OTHER): Payer: Medicare Other | Admitting: Pulmonary Disease

## 2022-11-15 ENCOUNTER — Encounter: Payer: Self-pay | Admitting: Pulmonary Disease

## 2022-11-15 VITALS — BP 110/62 | HR 59 | Temp 97.7°F | Ht 71.0 in | Wt 221.0 lb

## 2022-11-15 DIAGNOSIS — J42 Unspecified chronic bronchitis: Secondary | ICD-10-CM

## 2022-11-15 NOTE — Progress Notes (Signed)
Synopsis: Referred in February 2024 for pulmonary nodules by Georgiann Mccoy, MD  Subjective:   PATIENT ID: Chris Guzman GENDER: male DOB: [birth year], MRN: [id number]  HPI  Chief Complaint  Patient presents with   Follow-up    SOB  Breathing is good    Chris Guzman is an 89 year old male, never smoker with CAD s/p DES to LAD in 2010, congestive heart failure, HTN and HLD who is referred to pulmonary clinic for pulmonary nodules and shortness of breath.   Initial OV 08/05/22 He has chronic cough. He coughs for the first couple of hours of laying down for bed. Denies heartburn or reflux. He does have some wheezing with the cough. No sinus or nasal congestion and no post-nasal drainage. She does report he has had syncopal episodes recently which have been worked up by cardiology without significant etiology at this time. He has visit with neurology tomorrow. She reports one of the syncopal episodes was proceeded by a coughing fit, otherwise no significant cough associated with other events.   CT Chest 07/11/22 viewed in PACS, with no interstitial lung disease findings or opacities or infiltrates. Multiple small millimeter size nodules in lower lobes.   He is a never smoker. He had second hand smoke from his father. His mother died from long cancer. He worked for [employer]. He lives with his wife and she is his primary care taker.   Today - OV 11/15/22 His cough has improved with budesonide nebulizer treatments twice daily. He continues to have exertional dyspnea. He remains very sedentary.  Past Medical History:  Diagnosis Date   CAD (coronary artery disease)    aborted inf STEMI on treadmill 9/10; PCI of RCA, DES to LAD  with PTCA  of D2 9/10; relook cath 11/10-patent LAD stent with small branch vessel and dis RCA disease   Carotid artery occlusion    CHF (congestive heart failure) (HCC)    Elevated PSA    HTN (hypertension)    Hyperlipidemia    OSA (obstructive sleep apnea)     probable     Family History  Problem Relation Age of Onset   Pulmonary embolism Father    Heart attack Father 64   Lung cancer Mother    Heart attack Sister      Social History   Socioeconomic History   Marital status: Married    Spouse name: Not on file   Number of children: Not on file   Years of education: Not on file   Highest education level: Not on file  Occupational History   Not on file  Tobacco Use   Smoking status: Never   Smokeless tobacco: Never  Vaping Use   Vaping Use: Never used  Substance and Sexual Activity   Alcohol use: No   Drug use: No   Sexual activity: Not on file  Other Topics Concern   Not on file  Social History Narrative   Not on file   Social Determinants of Health   Financial Resource Strain: Not on file  Food Insecurity: Not on file  Transportation Needs: Not on file  Physical Activity: Not on file  Stress: Not on file  Social Connections: Not on file  Intimate Partner Violence: Not on file     Allergies  Allergen Reactions   Ace Inhibitors Cough     Outpatient Medications Prior to Visit  Medication Sig Dispense Refill   ammonium lactate (AMLACTIN) 12 % cream ammonium lactate 12 % topical cream  APPLY TO DRY SKIN ON LEGS ARMS AND BACK ONCE TO TWICE DAILY AFTER SHOWERS (AVOID FACE OPEN CUTS AND GROIN)     aspirin 81 MG tablet Take 81 mg by mouth daily.     budesonide (PULMICORT) 0.5 MG/2ML nebulizer solution Take 2 mLs (0.5 mg total) by nebulization in the morning and at bedtime. 120 mL 12   Cholecalciferol (VITAMIN D) 1000 UNITS capsule Take 1,000 Units by mouth daily.     Cyanocobalamin (VITAMIN B 12 PO) Take by mouth.     donepezil (ARICEPT) 10 MG tablet donepezil 10 mg tablet  Take 1 tablet every day by oral route.     fish oil-omega-3 fatty acids 1000 MG capsule Take 1 g by mouth 2 (two) times daily.     folic acid (FOLVITE) 400 MCG tablet Take 800 mcg by mouth daily.     furosemide (LASIX) 20 MG tablet Take 20 mg by  mouth daily as needed.     losartan (COZAAR) 50 MG tablet Take 25 mg by mouth daily.     memantine (NAMENDA) 10 MG tablet Take 10 mg by mouth 2 (two) times daily.     Multiple Vitamins-Minerals (CENTRUM SILVER PO) Take 1 capsule by mouth daily.     olmesartan (BENICAR) 20 MG tablet Take 10 mg by mouth daily. Take 1/2 of a 20mg  tablet once a day     pyridOXINE (VITAMIN B-6) 100 MG tablet Take 50 mg by mouth daily.     rosuvastatin (CRESTOR) 40 MG tablet Take 1 tablet by mouth once daily 90 tablet 3   vitamin B-12 (CYANOCOBALAMIN) 250 MCG tablet Take 500 mcg by mouth daily.     amLODipine (NORVASC) 10 MG tablet Take 1 tablet (10 mg total) by mouth daily. 180 tablet 3   escitalopram (LEXAPRO) 10 MG tablet Take 10 mg by mouth daily.     hydrocortisone 2.5 % ointment hydrocortisone 2.5 % topical ointment (Patient not taking: Reported on 11/15/2022)     triamcinolone cream (KENALOG) 0.1 % SMARTSIG:1 Application Topical 2-3 Times Daily (Patient not taking: Reported on 11/15/2022)     No facility-administered medications prior to visit.   Review of Systems  Constitutional:  Negative for chills, fever, malaise/fatigue and weight loss.  HENT:  Negative for congestion, sinus pain and sore throat.   Eyes: Negative.   Respiratory:  Positive for cough and shortness of breath. Negative for hemoptysis, sputum production and wheezing.   Cardiovascular:  Negative for chest pain, palpitations, orthopnea, claudication and leg swelling.  Gastrointestinal:  Negative for abdominal pain, heartburn, nausea and vomiting.  Genitourinary: Negative.   Musculoskeletal:  Negative for joint pain and myalgias.  Skin:  Negative for rash.  Neurological:  Negative for weakness.  Endo/Heme/Allergies: Negative.   Psychiatric/Behavioral:  Positive for depression. The patient is nervous/anxious.    Objective:   Vitals:   11/15/22 1315  BP: 110/62  Pulse: (!) 59  Temp: 97.7 F (36.5 C)  TempSrc: Oral  SpO2: 95%  Weight:  221 lb (100.2 kg)  Height: 5\' 11"  (1.803 m)    Physical Exam Constitutional:      General: He is not in acute distress. HENT:     Head: Normocephalic and atraumatic.  Cardiovascular:     Rate and Rhythm: Normal rate and regular rhythm.     Pulses: Normal pulses.     Heart sounds: Normal heart sounds. No murmur heard. Pulmonary:     Effort: Pulmonary effort is normal.     Breath  sounds: Normal breath sounds.  Musculoskeletal:     Right lower leg: No edema.     Left lower leg: No edema.  Skin:    General: Skin is warm and dry.  Neurological:     General: No focal deficit present.     Mental Status: He is alert.    CBC    Component Value Date/Time   WBC 7.8 06/21/2022 1524   RBC 4.15 (L) 06/21/2022 1524   HGB 13.6 06/21/2022 1524   HCT 41.0 06/21/2022 1524   PLT 137 (L) 06/21/2022 1524   MCV 98.8 06/21/2022 1524   MCH 32.8 06/21/2022 1524   MCHC 33.2 06/21/2022 1524   RDW 12.9 06/21/2022 1524   LYMPHSABS 1.2 05/01/2009 0210   MONOABS 0.7 05/01/2009 0210   EOSABS 0.1 05/01/2009 0210   BASOSABS 0.0 05/01/2009 0210      Latest Ref Rng & Units 06/21/2022    3:24 PM 04/15/2019   12:19 PM 03/06/2018   10:03 AM  BMP  Glucose 70 - 99 mg/dL 98  161  096   BUN 8 - 23 mg/dL 20  17  20    Creatinine 0.61 - 1.24 mg/dL 0.45  4.09  8.11   Sodium 135 - 145 mmol/L 139  139  143   Potassium 3.5 - 5.1 mmol/L 4.3  3.8  4.2   Chloride 98 - 111 mmol/L 101  105  109   CO2 22 - 32 mmol/L 29  24  26    Calcium 8.9 - 10.3 mg/dL 9.5  9.2  9.3    Chest imaging: CT Chest 07/11/22 viewed in PACS, with no interstitial lung disease findings or opacities or infiltrates. Multiple small millimeter size nodules in lower lobes.   PFT:     No data to display         Labs:  Path:  Echo 08/2020: LV EF 60-65%. Grade I diastolic dysfunction. RV systolic function and size are normal. LA moderately dilated.    Heart Catheterization:  Assessment & Plan:   Chronic bronchitis, unspecified  chronic bronchitis type [facility])  Discussion: Chris Guzman is an 89 year old male, never smoker with dementia, CAD s/p DES to LAD in 2010, congestive heart failure, HTN and HLD who returns to pulmonary clinic for chronic bronchitis  His cough has improved with budesonide nebulizer treatments twice daily. He is to continue this.  Recommended to his wife about getting more help at home, esxpecially PT to help him be active.   Follow up in 1 year or sooner if needed.   Melody Comas, MD [facility] Office: [phone]     Current Outpatient Medications:    ammonium lactate (AMLACTIN) 12 % cream, ammonium lactate 12 % topical cream  APPLY TO DRY SKIN ON LEGS ARMS AND BACK ONCE TO TWICE DAILY AFTER SHOWERS (AVOID FACE OPEN CUTS AND GROIN), Disp: , Rfl:    aspirin 81 MG tablet, Take 81 mg by mouth daily., Disp: , Rfl:    budesonide (PULMICORT) 0.5 MG/2ML nebulizer solution, Take 2 mLs (0.5 mg total) by nebulization in the morning and at bedtime., Disp: 120 mL, Rfl: 12   Cholecalciferol (VITAMIN D) 1000 UNITS capsule, Take 1,000 Units by mouth daily., Disp: , Rfl:    Cyanocobalamin (VITAMIN B 12 PO), Take by mouth., Disp: , Rfl:    donepezil (ARICEPT) 10 MG tablet, donepezil 10 mg tablet  Take 1 tablet every day by oral route., Disp: , Rfl:    fish  oil-omega-3 fatty acids 1000 MG capsule, Take 1 g by mouth 2 (two) times daily., Disp: , Rfl:    folic acid (FOLVITE) 400 MCG tablet, Take 800 mcg by mouth daily., Disp: , Rfl:    furosemide (LASIX) 20 MG tablet, Take 20 mg by mouth daily as needed., Disp: , Rfl:    losartan (COZAAR) 50 MG tablet, Take 25 mg by mouth daily., Disp: , Rfl:    memantine (NAMENDA) 10 MG tablet, Take 10 mg by mouth 2 (two) times daily., Disp: , Rfl:    Multiple Vitamins-Minerals (CENTRUM SILVER PO), Take 1 capsule by mouth daily., Disp: , Rfl:    olmesartan (BENICAR) 20 MG tablet, Take 10 mg by mouth daily. Take 1/2 of a 20mg  tablet once a day,  Disp: , Rfl:    pyridOXINE (VITAMIN B-6) 100 MG tablet, Take 50 mg by mouth daily., Disp: , Rfl:    rosuvastatin (CRESTOR) 40 MG tablet, Take 1 tablet by mouth once daily, Disp: 90 tablet, Rfl: 3   vitamin B-12 (CYANOCOBALAMIN) 250 MCG tablet, Take 500 mcg by mouth daily., Disp: , Rfl:    amLODipine (NORVASC) 10 MG tablet, Take 1 tablet (10 mg total) by mouth daily., Disp: 180 tablet, Rfl: 3   escitalopram (LEXAPRO) 10 MG tablet, Take 10 mg by mouth daily., Disp: , Rfl:    hydrocortisone 2.5 % ointment, hydrocortisone 2.5 % topical ointment (Patient not taking: Reported on 11/15/2022), Disp: , Rfl:    triamcinolone cream (KENALOG) 0.1 %, SMARTSIG:1 Application Topical 2-3 Times Daily (Patient not taking: Reported on 11/15/2022), Disp: , Rfl:

## 2022-11-15 NOTE — Patient Instructions (Addendum)
Continue budesonide nebulizer twice daily  Recommend looking into home nursing or physical therapy resources to help at home  Follow up in 1 year.

## 2022-12-30 ENCOUNTER — Ambulatory Visit: Payer: Medicare Other | Admitting: Surgery

## 2022-12-30 ENCOUNTER — Ambulatory Visit (HOSPITAL_COMMUNITY): Payer: Medicare Other

## 2023-02-03 ENCOUNTER — Encounter: Payer: Self-pay | Admitting: Neurology

## 2023-02-03 ENCOUNTER — Telehealth: Payer: Self-pay | Admitting: Neurology

## 2023-02-03 NOTE — Telephone Encounter (Signed)
Unable to LVM, VM box full. Sent letter in mail informing pt of need to reschedule 03/11/23 appt - MD out

## 2023-03-03 ENCOUNTER — Ambulatory Visit (HOSPITAL_COMMUNITY)
Admission: RE | Admit: 2023-03-03 | Discharge: 2023-03-03 | Disposition: A | Discharge status: Home / Self Care | Payer: Medicare Other | Source: Ambulatory Visit | Attending: Surgery | Admitting: Surgery

## 2023-03-03 ENCOUNTER — Ambulatory Visit (INDEPENDENT_AMBULATORY_CARE_PROVIDER_SITE_OTHER): Payer: Medicare Other | Admitting: Surgery

## 2023-03-03 ENCOUNTER — Encounter: Payer: Self-pay | Admitting: Surgery

## 2023-03-03 VITALS — BP 139/74 | HR 49 | Temp 98.1°F | Resp 20 | Ht 71.0 in | Wt 220.0 lb

## 2023-03-03 DIAGNOSIS — I6523 Occlusion and stenosis of bilateral carotid arteries: Secondary | ICD-10-CM | POA: Insufficient documentation

## 2023-03-03 DIAGNOSIS — I6521 Occlusion and stenosis of right carotid artery: Secondary | ICD-10-CM

## 2023-03-03 NOTE — Progress Notes (Signed)
[facility]  Patient name: Chris Guzman MRN: [id number] DOB: 06-Nov-1933 Sex: male   REASON FOR VISIT:    Follow up  HISOTRY OF PRESENT ILLNESS:   Chris Guzman is a 89 y.o. male that I met in April 2022 for evaluation of carotid stenosis.  At that time, the patient was having issues with dizziness which led to a MRI showing a high-grade right carotid stenosis.  His dizziness episodes ultimately resolved.  His carotid ultrasound only showed a 60-79% right-sided stenosis with minimal left-sided disease.  He was asymptomatic and so no intervention was performed.  He is back today for follow-up.  He continues to have occasional near syncopal type episodes he denies any strokelike symptoms such as numbness or weakness in either extremity.  Slurred speech, amaurosis fugax.  He is here today with his wife   The patient has history of coronary artery disease.  He is status post PCI in 2010.  He has a history of congestive heart failure with diastolic dysfunction.  He is medically managed for hypertension.  He is a non-smoker.  He is on a statin for hypercholesterolemia.  He does complain of worsening edema bilaterally, right greater than left that has been significantly improved with compression socks   PAST MEDICAL HISTORY:   Past Medical History:  Diagnosis Date   CAD (coronary artery disease)    aborted inf STEMI on treadmill 9/10; PCI of RCA, DES to LAD  with PTCA  of D2 9/10; relook cath 11/10-patent LAD stent with small branch vessel and dis RCA disease   Carotid artery occlusion    CHF (congestive heart failure) (HCC)    Elevated PSA    HTN (hypertension)    Hyperlipidemia    OSA (obstructive sleep apnea)    probable     FAMILY HISTORY:   Family History  Problem Relation Age of Onset   Pulmonary embolism Father    Heart attack Father 68   Lung cancer Mother    Heart attack Sister     SOCIAL HISTORY:    Social History   Tobacco Use   Smoking status: Never   Smokeless tobacco: Never  Substance Use Topics   Alcohol use: No     ALLERGIES:   Allergies  Allergen Reactions   Ace Inhibitors Cough     CURRENT MEDICATIONS:   Current Outpatient Medications  Medication Sig Dispense Refill   ammonium lactate (AMLACTIN) 12 % cream ammonium lactate 12 % topical cream  APPLY TO DRY SKIN ON LEGS ARMS AND BACK ONCE TO TWICE DAILY AFTER SHOWERS (AVOID FACE OPEN CUTS AND GROIN)     aspirin 81 MG tablet Take 81 mg by mouth daily.     budesonide (PULMICORT) 0.5 MG/2ML nebulizer solution Take 2 mLs (0.5 mg total) by nebulization in the morning and at bedtime. 120 mL 12   Cholecalciferol (VITAMIN D) 1000 UNITS capsule Take 1,000 Units by mouth daily.     Cyanocobalamin (VITAMIN B 12 PO) Take by mouth.     donepezil (ARICEPT) 10 MG tablet donepezil 10 mg tablet  Take 1 tablet every day by oral route.     escitalopram (LEXAPRO) 10 MG tablet Take 10 mg by mouth daily.     fish oil-omega-3 fatty acids 1000 MG capsule Take 1 g by mouth 2 (two) times daily.     folic acid (FOLVITE) 400 MCG tablet Take 800 mcg by mouth daily.     furosemide (LASIX) 20 MG tablet  Take 20 mg by mouth daily as needed.     hydrocortisone 2.5 % ointment      losartan (COZAAR) 50 MG tablet Take 25 mg by mouth daily.     memantine (NAMENDA) 10 MG tablet Take 10 mg by mouth 2 (two) times daily.     Multiple Vitamins-Minerals (CENTRUM SILVER PO) Take 1 capsule by mouth daily.     olmesartan (BENICAR) 20 MG tablet Take 10 mg by mouth daily. Take 1/2 of a 20mg  tablet once a day     pyridOXINE (VITAMIN B-6) 100 MG tablet Take 50 mg by mouth daily.     rosuvastatin (CRESTOR) 40 MG tablet Take 1 tablet by mouth once daily 90 tablet 3   triamcinolone cream (KENALOG) 0.1 %      vitamin B-12 (CYANOCOBALAMIN) 250 MCG tablet Take 500 mcg by mouth daily.     amLODipine (NORVASC) 10 MG tablet Take 1 tablet (10 mg total) by mouth daily.  180 tablet 3   No current facility-administered medications for this visit.    REVIEW OF SYSTEMS:   [X]  denotes positive finding, [ ]  denotes negative finding Cardiac  Comments:  Chest pain or chest pressure:    Shortness of breath upon exertion:    Short of breath when lying flat:    Irregular heart rhythm:        Vascular    Pain in calf, thigh, or hip brought on by ambulation:    Pain in feet at night that wakes you up from your sleep:     Blood clot in your veins:    Leg swelling:  x       Pulmonary    Oxygen at home:    Productive cough:     Wheezing:         Neurologic    Sudden weakness in arms or legs:     Sudden numbness in arms or legs:     Sudden onset of difficulty speaking or slurred speech:    Temporary loss of vision in one eye:     Problems with dizziness:         Gastrointestinal    Blood in stool:     Vomited blood:         Genitourinary    Burning when urinating:     Blood in urine:        Psychiatric    Major depression:         Hematologic    Bleeding problems:    Problems with blood clotting too easily:        Skin    Rashes or ulcers:        Constitutional    Fever or chills:      PHYSICAL EXAM:   Vitals:   03/03/23 1453 03/03/23 1455  BP: (!) 147/77 139/74  Pulse: (!) 49   Resp: 20   Temp: 98.1 F (36.7 C)   SpO2: 96%   Weight: 220 lb (99.8 kg)   Height: 5\' 11"  (1.803 m)     GENERAL: The patient is a well-nourished male, in no acute distress. The vital signs are documented above. CARDIAC: There is a regular rate and rhythm.  VASCULAR: Bilateral edema PULMONARY: Non-labored respirations MUSCULOSKELETAL: There are no major deformities or cyanosis. NEUROLOGIC: No focal weakness or paresthesias are detected. SKIN: There are no ulcers or rashes noted. PSYCHIATRIC: The patient has a normal affect.  STUDIES:   I have reviewed the following carotid ultrasound: Right  Carotid: Velocities in the right ICA are consistent with  a 60-79%                 stenosis.   Left Carotid: Velocities in the left ICA are consistent with a 1-39%  stenosis.   Vertebrals: Bilateral vertebral arteries demonstrate antegrade flow.  Subclavians: Normal flow hemodynamics were seen in bilateral subclavian               arteries.    MEDICAL ISSUES:   Carotid: The patient continues to have near syncopal episodes occasionally, however based off of his ultrasound with only a moderate stenosis of the right carotid artery with minimal disease in the left and antegrade vertebral artery flow, it is highly unlikely that this is the etiology for his.  In addition, he has significant cardiac issues that could be contributory.  Regardless, I would not recommend surgical intervention for his carotid stenosis at this time.  He should continue with his current medical regimen which is optimized for carotid disease.  I will plan on having him return in 6 months with repeat duplex imaging of his carotid    [name], MD, FACS [facility] [phone] Pager [phone]

## 2023-03-11 ENCOUNTER — Ambulatory Visit: Payer: Medicare Other | Admitting: Neurology

## 2023-03-12 ENCOUNTER — Other Ambulatory Visit: Payer: Self-pay

## 2023-03-12 DIAGNOSIS — I6523 Occlusion and stenosis of bilateral carotid arteries: Secondary | ICD-10-CM

## 2023-03-26 ENCOUNTER — Ambulatory Visit (INDEPENDENT_AMBULATORY_CARE_PROVIDER_SITE_OTHER): Payer: Medicare Other | Admitting: Neurology

## 2023-03-26 ENCOUNTER — Encounter: Payer: Self-pay | Admitting: Neurology

## 2023-03-26 VITALS — BP 125/71 | HR 57 | Ht 71.0 in | Wt 221.5 lb

## 2023-03-26 DIAGNOSIS — G301 Alzheimer's disease with late onset: Secondary | ICD-10-CM

## 2023-03-26 DIAGNOSIS — R054 Cough syncope: Secondary | ICD-10-CM

## 2023-03-26 DIAGNOSIS — F02B0 Dementia in other diseases classified elsewhere, moderate, without behavioral disturbance, psychotic disturbance, mood disturbance, and anxiety: Secondary | ICD-10-CM | POA: Diagnosis not present

## 2023-03-26 DIAGNOSIS — R55 Syncope and collapse: Secondary | ICD-10-CM | POA: Diagnosis not present

## 2023-03-26 NOTE — Progress Notes (Signed)
[facility]  PATIENT: Chris Guzman DOB: 1934-03-05  REQUESTING CLINICIAN: Pomposini, Rande Brunt, MD HISTORY FROM: Spouse and chart review  REASON FOR VISIT: Syncope   HISTORICAL  CHIEF COMPLAINT:  Chief Complaint  Patient presents with   Memory Loss    Rm12, wife present, Memory loss: MMSE was 66   INTERVAL HISTORY 03/26/2023 Patient presents today for follow-up, he is accompanied by her wife.  Last visit was in March, at that time he was complaining of cough syncope and laughing to the point that he will pass out (laugh-induced syncope).  We obtained a routine EEG and it was normal.  At that time, he was also noted to have memory deficit.  So wife tells me that he has a diagnosis of dementia, he is already on Namenda and Aricept and Lexapro and tolerating the medications very well.  Wife is helping with the cooking cleaning, wife gives him a bath a few times a week.  He can dress himself, he does not drive.  When he comes to the bill, wife does pay the bills.  He does do some crossword puzzle, word search but he does not exercise.  At last visit, he was referred to home PT which he completed but again after discharge he is not exercises as he should.  Wife tells me that his short-term memory is poor, he does repeat himself and has some question over.    HISTORY OF PRESENT ILLNESS:  This is a 89 year old gentleman past medical history of hypertension, hyperlipidemia, CHF, CAD, dementia and sleep apnea who is presenting with multiple syncopal episodes.  History mainly obtained from wife and chart review as patient has dementia.  Wife reports that patient has been having syncopal episodes since last summer.  These syncope are for the most part preceded by laugh or cough.  He will have these episodes while sitting down.  Wife reports the first episode happened last summer. Patient  was sitting down, having lunch, did not cough prior and became unresponsive. He started  leaning to one side, then fell, his foot got caught of the bottom of the high chair causing him to break it. He also hit his head.  But for the rest of the episodes again wife reported these are preceded by laughing or coughing.  These episodes last only a few second but sometimes can be long up to 15 seconds. No abnormal movements reported. Patient is not aware of the episodes. Patient does have chronic cough.  He has seen cardiology, had extensive workup and no etiology was found.  He denies any previous history of seizures.    OTHER MEDICAL CONDITIONS: Hypertension, hyperlipidemia CHF, CAD, Dementia, sleep apnea    REVIEW OF SYSTEMS: Full 14 system review of systems performed and negative with exception of: As noted in the HPI   ALLERGIES: Allergies  Allergen Reactions   Ace Inhibitors Cough    HOME MEDICATIONS: Outpatient Medications Prior to Visit  Medication Sig Dispense Refill   amLODipine (NORVASC) 10 MG tablet Take 1 tablet (10 mg total) by mouth daily. 180 tablet 3   ammonium lactate (AMLACTIN) 12 % cream ammonium lactate 12 % topical cream  APPLY TO DRY SKIN ON LEGS ARMS AND BACK ONCE TO TWICE DAILY AFTER SHOWERS (AVOID FACE OPEN CUTS AND GROIN)     aspirin 81 MG tablet Take 81 mg by mouth daily.     budesonide (PULMICORT) 0.5 MG/2ML nebulizer solution Take 2 mLs (0.5 mg total) by nebulization in  the morning and at bedtime. 120 mL 12   Cholecalciferol (VITAMIN D) 1000 UNITS capsule Take 1,000 Units by mouth daily.     Cyanocobalamin (VITAMIN B 12 PO) Take by mouth.     donepezil (ARICEPT) 10 MG tablet donepezil 10 mg tablet  Take 1 tablet every day by oral route.     escitalopram (LEXAPRO) 10 MG tablet Take 10 mg by mouth daily.     fish oil-omega-3 fatty acids 1000 MG capsule Take 1 g by mouth 2 (two) times daily.     folic acid (FOLVITE) 400 MCG tablet Take 800 mcg by mouth daily.     furosemide (LASIX) 20 MG tablet Take 20 mg by mouth daily as needed.     hydrocortisone  2.5 % ointment      losartan (COZAAR) 50 MG tablet Take 25 mg by mouth daily.     memantine (NAMENDA) 10 MG tablet Take 10 mg by mouth 2 (two) times daily.     Multiple Vitamins-Minerals (CENTRUM SILVER PO) Take 1 capsule by mouth daily.     olmesartan (BENICAR) 20 MG tablet Take 10 mg by mouth daily. Take 1/2 of a 20mg  tablet once a day     pyridOXINE (VITAMIN B-6) 100 MG tablet Take 50 mg by mouth daily.     rosuvastatin (CRESTOR) 40 MG tablet Take 1 tablet by mouth once daily 90 tablet 3   triamcinolone cream (KENALOG) 0.1 %      vitamin B-12 (CYANOCOBALAMIN) 250 MCG tablet Take 500 mcg by mouth daily.     No facility-administered medications prior to visit.    PAST MEDICAL HISTORY: Past Medical History:  Diagnosis Date   CAD (coronary artery disease)    aborted inf STEMI on treadmill 9/10; PCI of RCA, DES to LAD  with PTCA  of D2 9/10; relook cath 11/10-patent LAD stent with small branch vessel and dis RCA disease   Carotid artery occlusion    CHF (congestive heart failure) (HCC)    Elevated PSA    HTN (hypertension)    Hyperlipidemia    OSA (obstructive sleep apnea)    probable    PAST SURGICAL HISTORY: History reviewed. No pertinent surgical history.  FAMILY HISTORY: Family History  Problem Relation Age of Onset   Pulmonary embolism Father    Heart attack Father 82   Lung cancer Mother    Heart attack Sister     SOCIAL HISTORY: Social History   Socioeconomic History   Marital status: Married    Spouse name: Not on file   Number of children: Not on file   Years of education: Not on file   Highest education level: Not on file  Occupational History   Not on file  Tobacco Use   Smoking status: Never   Smokeless tobacco: Never  Vaping Use   Vaping status: Never Used  Substance and Sexual Activity   Alcohol use: No   Drug use: No   Sexual activity: Not on file  Other Topics Concern   Not on file  Social History Narrative   Not on file   Social  Determinants of Health   Financial Resource Strain: Not on file  Food Insecurity: Not on file  Transportation Needs: Not on file  Physical Activity: Not on file  Stress: Not on file  Social Connections: Not on file  Intimate Partner Violence: Not on file     PHYSICAL EXAM  GENERAL EXAM/CONSTITUTIONAL: Vitals:  Vitals:   03/26/23 1338  BP:  125/71  Pulse: (!) 57  Weight: 221 lb 8 oz (100.5 kg)  Height: 5\' 11"  (1.803 m)   Body mass index is 30.89 kg/m. Wt Readings from Last 3 Encounters:  03/26/23 221 lb 8 oz (100.5 kg)  03/03/23 220 lb (99.8 kg)  11/15/22 221 lb (100.2 kg)   Patient is in no distress; well developed, nourished and groomed; neck is supple  MUSCULOSKELETAL: Gait, strength, tone, movements noted in Neurologic exam below  NEUROLOGIC: MENTAL STATUS:     03/26/2023    1:41 PM  MMSE - Mini Mental State Exam  Orientation to time 3  Orientation to Place 4  Registration 3  Attention/ Calculation 2  Recall 0  Language- name 2 objects 2  Language- repeat 0  Language- follow 3 step command 2  Language- read & follow direction 1  Write a sentence 1  Copy design 1  Total score 19   awake, alert, oriented to person only not time.   Unable to provide history due to dementia Unable to count the number of quarters in $1 or $2.  Unable to state the days of the week backward.   CRANIAL NERVE:  2nd, 3rd, 4th, 6th - Visual fields full to confrontation, extraocular muscles intact, no nystagmus 5th - facial sensation symmetric 7th - facial strength symmetric 8th - hearing intact 9th - palate elevates symmetrically, uvula midline 11th - shoulder shrug symmetric 12th - tongue protrusion midline  MOTOR:  normal bulk and tone, full strength in the BUE, BLE  SENSORY:  normal and symmetric to light touch  COORDINATION:  finger-nose-finger, fine finger movements normal  GAIT/STATION:  Wide based and unsteady   DIAGNOSTIC DATA (LABS, IMAGING, TESTING) -  I reviewed patient records, labs, notes, testing and imaging myself where available.  Lab Results  Component Value Date   WBC 7.8 06/21/2022   HGB 13.6 06/21/2022   HCT 41.0 06/21/2022   MCV 98.8 06/21/2022   PLT 137 (L) 06/21/2022      Component Value Date/Time   NA 139 06/21/2022 1524   K 4.3 06/21/2022 1524   CL 101 06/21/2022 1524   CO2 29 06/21/2022 1524   GLUCOSE 98 06/21/2022 1524   BUN 20 06/21/2022 1524   CREATININE 1.40 (H) 06/21/2022 1524   CALCIUM 9.5 06/21/2022 1524   PROT 6.7 04/15/2019 1219   ALBUMIN 4.1 04/15/2019 1219   AST 27 04/15/2019 1219   ALT 23 04/15/2019 1219   ALKPHOS 43 04/15/2019 1219   BILITOT 1.3 (H) 04/15/2019 1219   GFRNONAA 49 (L) 06/21/2022 1524   GFRAA 59 (L) 04/15/2019 1219   Lab Results  Component Value Date   CHOL 124 04/15/2019   HDL 58 04/15/2019   LDLCALC 55 04/15/2019   TRIG 55 04/15/2019   CHOLHDL 2.1 04/15/2019   Lab Results  Component Value Date   HGBA1C  03/21/2009    5.4 (NOTE) The ADA recommends the following therapeutic goal for glycemic control related to Hgb A1c measurement: Goal of therapy: <6.5 Hgb A1c  Reference: American Diabetes Association: Clinical Practice Recommendations 2010, Diabetes Care, 2010, 33: (Suppl  1).   Lab Results  Component Value Date   VITAMINB12 517 03/21/2009   Lab Results  Component Value Date   TSH 1.397 03/14/2017    CT Head 06/21/2022 Chronic atrophic and ischemic changes without acute abnormality.     ASSESSMENT AND PLAN  89 y.o. year old male with past medical history including CAD, CHF, hypertension, hyperlipidemia, dementia and sleep  apnea who is presenting for follow up.  For his cough syncope and laughter induced syncope, wife tells me that he continued to have those episodes but when the coughing or the laughing is taking longer, wife will shake him to stop him and prevent further episodes from happening.   When he comes to his memory, patient likely has moderate  Alzheimer dementia, he is already on Namenda and Aricept, denies any agitation, no irritability and no hallucinations, advised patient to continue current medications.  I also discussed with him the importance of physical activity.  He was understanding and reported plan to start walking daily.  Continue to follow with your PCP and return for any other neurological symptoms or concerns.     1. 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 Cough syncope   3. Syncope, unspecified syncope type       Patient Instructions  Continue current medications  Return as needed   There are well-accepted and sensible ways to reduce risk for Alzheimers disease and other degenerative brain disorders .  Exercise Daily Walk A daily 20 minute walk should be part of your routine. Disease related apathy can be a significant roadblock to exercise and the only way to overcome this is to make it a daily routine and perhaps have a reward at the end (something your loved one loves to eat or drink perhaps) or a personal trainer coming to the home can also be very useful. Most importantly, the patient is much more likely to exercise if the caregiver / spouse does it with him/her. In general a structured, repetitive schedule is best.  General Health: Any diseases which effect your body will effect your brain such as a pneumonia, urinary infection, blood clot, heart attack or stroke. Keep contact with your primary care doctor for regular follow ups.  Sleep. A good nights sleep is healthy for the brain. Seven hours is recommended. If you have insomnia or poor sleep habits we can give you some instructions. If you have sleep apnea wear your mask.  Diet: Eating a heart healthy diet is also a good idea; fish and poultry instead of red meat, nuts (mostly non-peanuts), vegetables, fruits, olive oil or canola oil (instead of butter), minimal  salt (use other spices to flavor foods), whole grain rice, bread, cereal and pasta and wine in moderation.Research is now showing that the MIND diet, which is a combination of The Mediterranean diet and the DASH diet, is beneficial for cognitive processing and longevity. Information about this diet can be found in The MIND Diet, a book by Alonna Minium, MS, RDN, and online at [employer]  Finances, Power of [address] and Advance Directives: You should consider putting legal safeguards in place with regard to financial and medical decision making. While the spouse always has power of attorney for medical and financial issues in the absence of any form, you should consider what you want in case the spouse / caregiver is no longer around or capable of making decisions.    No orders of the defined types were placed in this encounter.   No orders of the defined types were placed in this encounter.   Return if symptoms worsen or fail to improve.  I have spent a total of 45 minutes dedicated to this patient today, preparing to see patient, performing a medically appropriate examination and evaluation, ordering tests and/or medications and procedures, and counseling and educating the patient/family/caregiver; independently  interpreting result and communicating results to the family/patient/caregiver; and documenting clinical information in the electronic medical record.   Windell Norfolk, MD 03/26/2023, 9:49 PM  [facility] [address], [address] [place], [place] [address] [phone]

## 2023-03-26 NOTE — Patient Instructions (Signed)
Continue current medications  Return as needed   There are well-accepted and sensible ways to reduce risk for Alzheimers disease and other degenerative brain disorders .  Exercise Daily Walk A daily 20 minute walk should be part of your routine. Disease related apathy can be a significant roadblock to exercise and the only way to overcome this is to make it a daily routine and perhaps have a reward at the end (something your loved one loves to eat or drink perhaps) or a personal trainer coming to the home can also be very useful. Most importantly, the patient is much more likely to exercise if the caregiver / spouse does it with him/her. In general a structured, repetitive schedule is best.  General Health: Any diseases which effect your body will effect your brain such as a pneumonia, urinary infection, blood clot, heart attack or stroke. Keep contact with your primary care doctor for regular follow ups.  Sleep. A good nights sleep is healthy for the brain. Seven hours is recommended. If you have insomnia or poor sleep habits we can give you some instructions. If you have sleep apnea wear your mask.  Diet: Eating a heart healthy diet is also a good idea; fish and poultry instead of red meat, nuts (mostly non-peanuts), vegetables, fruits, olive oil or canola oil (instead of butter), minimal salt (use other spices to flavor foods), whole grain rice, bread, cereal and pasta and wine in moderation.Research is now showing that the MIND diet, which is a combination of The Mediterranean diet and the DASH diet, is beneficial for cognitive processing and longevity. Information about this diet can be found in The MIND Diet, a book by Alonna Minium, MS, RDN, and online at [employer]  Finances, Power of [address] and Advance Directives: You should consider putting legal safeguards in place with regard to financial and medical decision making. While the spouse always has power of  attorney for medical and financial issues in the absence of any form, you should consider what you want in case the spouse / caregiver is no longer around or capable of making decisions.

## 2023-08-19 LAB — LAB REPORT - SCANNED: Calcium: 10.2

## 2023-08-24 NOTE — Progress Notes (Signed)
 [facility] Note  Date:  08/25/2023   ID:  Chris Guzman, DOB 1934/03/05, MRN [id number]  Location: Home  Provider location: [address] [place] Type of Visit: Established  PCP:  Pomposini, Rande Brunt, MD  HF Cardiologist: Dr. Gala Romney  Chief Complaint: cardiac follow up   HPI: Chris Guzman is a 89 y.o. male with HTN, OSA. CAD s/p DES to LAD and RCA + PTCA diagonal in Sept 2010. In November 2010 had relook cath showing patent stent and small vessel dz, not favorable for PCI.   Carotid dopplers (3/13): Minimal disease.  Echo (10/15): EF 60-65%, mild dilation of aortic root and ascending aorta (4.2/4.0 cm), normal RV size and systolic function, aortic sclerosis without stenosis.    Myoview 6/17: Nuclear stress EF: 55%. There was no ST segment deviation noted during stress. There is a small defect of mild severity present in the basal inferior and mid inferior location. The defect is partially-reversible. This is most consistent with variations in diaphragmatic attenuation. This is a low risk study. The left ventricular ejection fraction is normal (55-65%).   Echo 11/17: EF 65-70%. Grade I DD. Aortic sclerosis without stenosis. AoRoot 4.4cm   Chest CT 9/19: Stable AoRoot aneurysm 4.1c  Echo 3/22: EF 60-65% RV normal. Mild aortic sclerosis AoRoot stable at 4.1cm   Seen in ED 07/22/21 after wife noticed several episodes of him staring and unresponsive, had difficulty arousing him. Patient had no recollection of episodes. CT head negative for acute findings. Saw Dr. Myra Gianotti 07/05/22, did not feel extracranial carotid or vertebral disease contributing to syncopal episodes (felt to be cough syncope). Repeat dopplers stable with RICA 60-70%  Echo 2/24 EF 60-65% mild AS mean gradient 9   Here for routine f/u with his wife and daughter. Was seen in [place] last week for UTI and urinary retention. Unable to provide much history. No CP or SOB. CT showed  AAA at 3.8      Past Medical History:  Diagnosis Date   CAD (coronary artery disease)    aborted inf STEMI on treadmill 9/10; PCI of RCA, DES to LAD  with PTCA  of D2 9/10; relook cath 11/10-patent LAD stent with small branch vessel and dis RCA disease   Carotid artery occlusion    CHF (congestive heart failure) (HCC)    Elevated PSA    HTN (hypertension)    Hyperlipidemia    OSA (obstructive sleep apnea)    probable   History reviewed. No pertinent surgical history.   Current Outpatient Medications  Medication Sig Dispense Refill   amLODipine (NORVASC) 10 MG tablet Take 1 tablet (10 mg total) by mouth daily. 180 tablet 3   ammonium lactate (AMLACTIN) 12 % cream ammonium lactate 12 % topical cream  APPLY TO DRY SKIN ON LEGS ARMS AND BACK ONCE TO TWICE DAILY AFTER SHOWERS (AVOID FACE OPEN CUTS AND GROIN)     aspirin 81 MG tablet Take 81 mg by mouth daily.     budesonide (PULMICORT) 0.5 MG/2ML nebulizer solution Take 2 mLs (0.5 mg total) by nebulization in the morning and at bedtime. 120 mL 12   cefdinir (OMNICEF) 300 MG capsule Take 300 mg by mouth 2 (two) times daily.     Cholecalciferol (VITAMIN D) 1000 UNITS capsule Take 1,000 Units by mouth daily.     Cyanocobalamin (VITAMIN B 12 PO) Take by mouth.     donepezil (ARICEPT) 10 MG tablet donepezil 10 mg tablet  Take  1 tablet every day by oral route.     escitalopram (LEXAPRO) 10 MG tablet Take 10 mg by mouth daily.     fish oil-omega-3 fatty acids 1000 MG capsule Take 1 g by mouth 2 (two) times daily.     folic acid (FOLVITE) 800 MCG tablet Take 800 mcg by mouth daily.     furosemide (LASIX) 20 MG tablet Take 20 mg by mouth daily as needed.     hydrocortisone 2.5 % ointment      losartan (COZAAR) 50 MG tablet Take 25 mg by mouth daily.     memantine (NAMENDA) 10 MG tablet Take 10 mg by mouth 2 (two) times daily.     Multiple Vitamins-Minerals (CENTRUM SILVER PO) Take 1 capsule by mouth daily.     pyridOXINE (VITAMIN B-6) 100 MG  tablet Take 50 mg by mouth daily.     rosuvastatin (CRESTOR) 40 MG tablet Take 1 tablet by mouth once daily 90 tablet 3   tamsulosin (FLOMAX) 0.4 MG CAPS capsule Take 0.4 mg by mouth daily.     triamcinolone cream (KENALOG) 0.1 %      vitamin B-12 (CYANOCOBALAMIN) 250 MCG tablet Take 500 mcg by mouth daily.     olmesartan (BENICAR) 20 MG tablet Take 10 mg by mouth daily. Take 1/2 of a 20mg  tablet once a day (Patient not taking: Reported on 08/25/2023)     No current facility-administered medications for this encounter.    Allergies:   Ace inhibitors   Social History:  The patient  reports that he has never smoked. He has never used smokeless tobacco. He reports that he does not drink alcohol and does not use drugs.   Family History:  The patient's family history includes Heart attack in his sister; Heart attack (age of onset: 44) in his father; Lung cancer in his mother; Pulmonary embolism in his father.   ROS:  Please see the history of present illness.   All other systems are personally reviewed and negative.   Recent Labs: No results found for requested labs within last 365 days.  Personally reviewed   Wt Readings from Last 3 Encounters:  08/25/23 103.2 kg (227 lb 9.6 oz)  03/26/23 100.5 kg (221 lb 8 oz)  03/03/23 99.8 kg (220 lb)   BP (!) 90/58   Pulse 60   Wt 103.2 kg (227 lb 9.6 oz)   SpO2 93%   BMI 31.74 kg/m   Physical Exam:   General:  Well appearing. No resp difficulty HEENT: normal Neck: supple. JVP 5-6. Carotids 2+ bilat; no bruits. No lymphadenopathy or thryomegaly appreciated. Cor: PMI nondisplaced. Regular rate & rhythm.2/6 SEM Lungs: clear Abdomen: soft, nontender, nondistended. No hepatosplenomegaly. No bruits or masses. Good bowel sounds. Extremities: no cyanosis, clubbing, rash, 1+ edema R>L Neuro: alert  cranial nerves grossly intact. moves all 4 extremities w/o difficulty. Affect pleasant  Assessment & Plan  1.  CAD - s/p DES to LAD in 2010, also with  PTCA to RCA and diagonal in 02/2009 - Nuclear study in 11/2015 nonischemic.  - He notes that his anginal equivalent is stomach pain. No recent s/s angina - Continue ASA/statin.  2. Syncope - Zio with 7 runs brief SVT - Evaluated by Neuro and [facility]. Felt to be cough syncope -> resolved  3.  HTN  - Blood pressure well controlled, no low home BPs per wife. - Continue current regimen.   4.  Hyperlipidemia  - Goal LDL < 70.  - Continue  rosuvastatin - Followed by PCP.   5.  Carotid stenosis - RICA (1/24) 60-70% followed by Dr. Myra Gianotti with [facility].  6. Mild aortic stenosi - Echo 2/24 Mild AS    7.  Aortic root aneurysm  - Chest CT 02/2018 with aortic root 4.1 Stable - Echo 11/20 Ao Root stable at 4.0cm - Echo 3/22: EF 60-65% RV normal. Mild aortic sclerosis AoRoot stable at 4.1cm  - Stable on echo 2/24    8. LE edema - due to venous stasis.  - U/s with [facility] -> moderate great saphenous reflux bilaterally. - Continue support stockings and PRN lasix. - ReDS 35% -> volume status well controlled   Signed, Arvilla Meres, MD  08/25/2023 12:26 PM  [facility] [address] [facility] [place] [address] [phone] (office) [phone] (fax)

## 2023-08-25 ENCOUNTER — Ambulatory Visit (HOSPITAL_COMMUNITY)
Admission: RE | Admit: 2023-08-25 | Discharge: 2023-08-25 | Disposition: A | Discharge status: Home / Self Care | Payer: Medicare Other | Source: Ambulatory Visit | Attending: Internal Medicine | Admitting: Internal Medicine

## 2023-08-25 ENCOUNTER — Encounter (HOSPITAL_COMMUNITY): Payer: Self-pay | Admitting: Internal Medicine

## 2023-08-25 VITALS — BP 90/58 | HR 60 | Wt 227.6 lb

## 2023-08-25 DIAGNOSIS — I471 Supraventricular tachycardia, unspecified: Secondary | ICD-10-CM | POA: Insufficient documentation

## 2023-08-25 DIAGNOSIS — Z955 Presence of coronary angioplasty implant and graft: Secondary | ICD-10-CM | POA: Insufficient documentation

## 2023-08-25 DIAGNOSIS — I6529 Occlusion and stenosis of unspecified carotid artery: Secondary | ICD-10-CM | POA: Diagnosis not present

## 2023-08-25 DIAGNOSIS — G4733 Obstructive sleep apnea (adult) (pediatric): Secondary | ICD-10-CM | POA: Diagnosis not present

## 2023-08-25 DIAGNOSIS — E785 Hyperlipidemia, unspecified: Secondary | ICD-10-CM | POA: Diagnosis not present

## 2023-08-25 DIAGNOSIS — F0390 Unspecified dementia without behavioral disturbance: Secondary | ICD-10-CM | POA: Diagnosis not present

## 2023-08-25 DIAGNOSIS — I359 Nonrheumatic aortic valve disorder, unspecified: Secondary | ICD-10-CM

## 2023-08-25 DIAGNOSIS — I10 Essential (primary) hypertension: Secondary | ICD-10-CM | POA: Insufficient documentation

## 2023-08-25 DIAGNOSIS — I2510 Atherosclerotic heart disease of native coronary artery without angina pectoris: Secondary | ICD-10-CM | POA: Diagnosis not present

## 2023-08-25 DIAGNOSIS — I700 Atherosclerosis of aorta: Secondary | ICD-10-CM | POA: Insufficient documentation

## 2023-08-25 DIAGNOSIS — Q2543 Congenital aneurysm of aorta: Secondary | ICD-10-CM | POA: Diagnosis not present

## 2023-08-25 DIAGNOSIS — R55 Syncope and collapse: Secondary | ICD-10-CM | POA: Diagnosis not present

## 2023-08-25 DIAGNOSIS — Z79899 Other long term (current) drug therapy: Secondary | ICD-10-CM | POA: Insufficient documentation

## 2023-08-25 DIAGNOSIS — F0150 Vascular dementia without behavioral disturbance: Secondary | ICD-10-CM

## 2023-08-25 NOTE — Progress Notes (Signed)
 CSW referred to assist wife and daughter with resources as patient is declining. Wife describes her daily routine of caregiving for patient and states that his needs are beyond her abilities at this time. His cognitive abilities have declined making it more difficult to provide the physical care as he is not able to participate or follow commands. She has attempted to hire private pay caregivers to assist with ADL's although they never showed up and the cost is exorbitant.   CSW discussed options from Adult Day Care, Home Care and ALF/SNF. CSW suggested that wife/daughter speak with an Elder Law attorney to get advice on community spousal protection for assets as they seem interested in pursuing placement for patient. CSW provided contact information if further questions or assistance needed. Lasandra Beech, LCSW, CCSW-MCS [phone]

## 2023-08-25 NOTE — Patient Instructions (Signed)
 Medication Changes:  STOP Amlodipine  STOP Losartan  Referrals:  You have been referred to our [facility] social worker.   Special Instructions // Education:  Do the following things EVERYDAY: Weigh yourself in the morning before breakfast. Write it down and keep it in a log. Take your medicines as prescribed Eat low salt foods--Limit salt (sodium) to 2000 mg per day.  Stay as active as you can everyday Limit all fluids for the day to less than 2 liters   Follow-Up in: Please follow up with the [facility] in 6 months with Dr. Gala Romney. We currently do not have that schedule. Please call us in May in order to schedule that appointment for July.  At the [facility], you and your health needs are our priority. We have a designated team specialized in the treatment of Heart Failure. This Care Team includes your primary Heart Failure Specialized Cardiologist (physician), Advanced Practice Providers (APPs- Physician Assistants and Nurse Practitioners), and Pharmacist who all work together to provide you with the care you need, when you need it.   You may see any of the following providers on your designated Care Team at your next follow up:  Dr. Arvilla Meres Dr. Marca Ancona Dr. Dorthula Nettles Dr. Theresia Bough Tonye Becket, NP Robbie Lis, [place] [facility] [place], [place] Brynda Peon, NP [place] Lee, NP Karle Plumber, PharmD   Please be sure to bring in all your medications bottles to every appointment.   Need to Contact us:  If you have any questions or concerns before your next appointment please send [place] a message through [place] or call our office at [phone].    TO LEAVE A MESSAGE FOR THE NURSE SELECT OPTION 2, PLEASE LEAVE A MESSAGE INCLUDING: YOUR NAME DATE OF BIRTH CALL BACK NUMBER REASON FOR CALL**this is important as we prioritize the call backs  YOU WILL RECEIVE A CALL BACK THE SAME DAY AS  LONG AS YOU CALL BEFORE 4:00 PM

## 2023-08-27 ENCOUNTER — Telehealth: Payer: Self-pay

## 2023-08-27 NOTE — Telephone Encounter (Addendum)
 Advice: -received message that pt wife asking for advice on rescheduling or keeping the appt.   -attempted call back w/ no answer.  VM full  -spoke to wife and she will call back by Friday if she wants to cancel otherwise they will be here.

## 2023-08-28 NOTE — Telephone Encounter (Signed)
Error

## 2023-09-01 ENCOUNTER — Ambulatory Visit (INDEPENDENT_AMBULATORY_CARE_PROVIDER_SITE_OTHER): Payer: Medicare Other | Admitting: Physician Assistant

## 2023-09-01 ENCOUNTER — Ambulatory Visit (HOSPITAL_COMMUNITY)
Admission: RE | Admit: 2023-09-01 | Discharge: 2023-09-01 | Disposition: A | Discharge status: Home / Self Care | Payer: Medicare Other | Source: Ambulatory Visit | Attending: Surgery | Admitting: Surgery

## 2023-09-01 VITALS — BP 134/76 | HR 81 | Temp 98.3°F | Resp 20 | Ht 71.0 in

## 2023-09-01 DIAGNOSIS — I6523 Occlusion and stenosis of bilateral carotid arteries: Secondary | ICD-10-CM

## 2023-09-01 NOTE — Progress Notes (Deleted)
 History of Present Illness:  Patient is a 89 y.o. year old male who presents for evaluation of carotid stenosis.  The patient denies symptoms of TIA, amaurosis, or stroke.  The patient is currently on *** antiplatelet therapy.  The carotid stenosis was found ***.  Other medical problems include ***.  These are currently stable and followed by ***.  Past Medical History:  Diagnosis Date   CAD (coronary artery disease)    aborted inf STEMI on treadmill 9/10; PCI of RCA, DES to LAD  with PTCA  of D2 9/10; relook cath 11/10-patent LAD stent with small branch vessel and dis RCA disease   Carotid artery occlusion    CHF (congestive heart failure) (HCC)    Elevated PSA    HTN (hypertension)    Hyperlipidemia    OSA (obstructive sleep apnea)    probable    No past surgical history on file.   Social History Social History   Tobacco Use   Smoking status: Never   Smokeless tobacco: Never  Vaping Use   Vaping status: Never Used  Substance Use Topics   Alcohol use: No   Drug use: No    Family History Family History  Problem Relation Age of Onset   Pulmonary embolism Father    Heart attack Father 61   Lung cancer Mother    Heart attack Sister     Allergies  Allergies  Allergen Reactions   Ace Inhibitors Cough     Current Outpatient Medications  Medication Sig Dispense Refill   ammonium lactate (AMLACTIN) 12 % cream ammonium lactate 12 % topical cream  APPLY TO DRY SKIN ON LEGS ARMS AND BACK ONCE TO TWICE DAILY AFTER SHOWERS (AVOID FACE OPEN CUTS AND GROIN)     aspirin 81 MG tablet Take 81 mg by mouth daily.     budesonide (PULMICORT) 0.5 MG/2ML nebulizer solution Take 2 mLs (0.5 mg total) by nebulization in the morning and at bedtime. 120 mL 12   cefdinir (OMNICEF) 300 MG capsule Take 300 mg by mouth 2 (two) times daily.     Cholecalciferol (VITAMIN D) 1000 UNITS capsule Take 1,000 Units by mouth daily.     Cyanocobalamin (VITAMIN B 12 PO) Take by mouth.     donepezil  (ARICEPT) 10 MG tablet donepezil 10 mg tablet  Take 1 tablet every day by oral route.     escitalopram (LEXAPRO) 10 MG tablet Take 10 mg by mouth daily.     fish oil-omega-3 fatty acids 1000 MG capsule Take 1 g by mouth 2 (two) times daily.     folic acid (FOLVITE) 800 MCG tablet Take 800 mcg by mouth daily.     furosemide (LASIX) 20 MG tablet Take 20 mg by mouth daily as needed.     hydrocortisone 2.5 % ointment      memantine (NAMENDA) 10 MG tablet Take 10 mg by mouth 2 (two) times daily.     Multiple Vitamins-Minerals (CENTRUM SILVER PO) Take 1 capsule by mouth daily.     olmesartan (BENICAR) 20 MG tablet Take 10 mg by mouth daily. Take 1/2 of a 20mg  tablet once a day (Patient not taking: Reported on 08/25/2023)     pyridOXINE (VITAMIN B-6) 100 MG tablet Take 50 mg by mouth daily.     rosuvastatin (CRESTOR) 40 MG tablet Take 1 tablet by mouth once daily (Patient not taking: Reported on 09/01/2023) 90 tablet 3   tamsulosin (FLOMAX) 0.4 MG CAPS capsule Take 0.4 mg by mouth  daily.     triamcinolone cream (KENALOG) 0.1 %      vitamin B-12 (CYANOCOBALAMIN) 250 MCG tablet Take 500 mcg by mouth daily.     No current facility-administered medications for this visit.    ROS:   General:  No weight loss, Fever, chills  HEENT: No recent headaches, no nasal bleeding, no visual changes, no sore throat  Neurologic: No dizziness, blackouts, seizures. No recent symptoms of stroke or mini- stroke. No recent episodes of slurred speech, or temporary blindness.  Cardiac: No recent episodes of chest pain/pressure, no shortness of breath at rest.  No shortness of breath with exertion.  Denies history of atrial fibrillation or irregular heartbeat  Vascular: No history of rest pain in feet.  No history of claudication.  No history of non-healing ulcer, No history of DVT   Pulmonary: No home oxygen, no productive cough, no hemoptysis,  No asthma or wheezing  Musculoskeletal:  [ ]  Arthritis, [ ]  Low back pain,   [ ]  Joint pain  Hematologic:No history of hypercoagulable state.  No history of easy bleeding.  No history of anemia  Gastrointestinal: No hematochezia or melena,  No gastroesophageal reflux, no trouble swallowing  Urinary: [ ]  chronic Kidney disease, [ ]  on HD - [ ]  MWF or [ ]  TTHS, [ ]  Burning with urination, [ ]  Frequent urination, [ ]  Difficulty urinating;   Skin: No rashes  Psychological: No history of anxiety,  No history of depression   Physical Examination  Vitals:   09/01/23 1359  BP: 134/76  Pulse: 81  Resp: 20  Temp: 98.3 F (36.8 C)  TempSrc: Temporal  SpO2: 96%  Height: 5\' 11"  (1.803 m)    Body mass index is 31.74 kg/m.  General:  Alert and oriented, no acute distress HEENT: Normal Neck: No bruit or JVD Pulmonary: Clear to auscultation bilaterally Cardiac: Regular Rate and Rhythm without murmur Gastrointestinal: Soft, non-tender, non-distended, no mass, no scars Skin: No rash Extremity Pulses:  2+ radial, brachial, femoral, dorsalis pedis, posterior tibial pulses bilaterally Musculoskeletal: No deformity or edema  Neurologic: Upper and lower extremity motor 5/5 and symmetric  DATA:    ASSESSMENT:    PLAN:    [facility] Office: [phone] Pager: [phone]

## 2023-09-01 NOTE — Progress Notes (Signed)
 History of Present Illness:  Patient is a 89 y.o. year old male who presents for evaluation of carotid stenosis.  He was initially seen back in April of 2022 by Dr. Myra Gianotti.  At that time he had symptoms of dizziness and was found to have right ICA stenosis 60-79%.  He denies amaurosis, weakness on one side of the body, trouble speaking or swallowing.  His wife is with him.     The patient denies symptoms of TIA, amaurosis, or stroke. He has a history of coronary artery disease.  He is status post PCI in 2010.  He has a history of congestive heart failure with diastolic dysfunction.  He was placed in compression socks for LE edema and these do help when he wears them per his wife's report.      Past Medical History:  Diagnosis Date   CAD (coronary artery disease)    aborted inf STEMI on treadmill 9/10; PCI of RCA, DES to LAD  with PTCA  of D2 9/10; relook cath 11/10-patent LAD stent with small branch vessel and dis RCA disease   Carotid artery occlusion    CHF (congestive heart failure) (HCC)    Elevated PSA    HTN (hypertension)    Hyperlipidemia    OSA (obstructive sleep apnea)    probable    No past surgical history on file.   Social History Social History   Tobacco Use   Smoking status: Never   Smokeless tobacco: Never  Vaping Use   Vaping status: Never Used  Substance Use Topics   Alcohol use: No   Drug use: No    Family History Family History  Problem Relation Age of Onset   Pulmonary embolism Father    Heart attack Father 56   Lung cancer Mother    Heart attack Sister     Allergies  Allergies  Allergen Reactions   Ace Inhibitors Cough     Current Outpatient Medications  Medication Sig Dispense Refill   ammonium lactate (AMLACTIN) 12 % cream ammonium lactate 12 % topical cream  APPLY TO DRY SKIN ON LEGS ARMS AND BACK ONCE TO TWICE DAILY AFTER SHOWERS (AVOID FACE OPEN CUTS AND GROIN)     aspirin 81 MG tablet Take 81 mg by mouth daily.      budesonide (PULMICORT) 0.5 MG/2ML nebulizer solution Take 2 mLs (0.5 mg total) by nebulization in the morning and at bedtime. 120 mL 12   cefdinir (OMNICEF) 300 MG capsule Take 300 mg by mouth 2 (two) times daily.     Cholecalciferol (VITAMIN D) 1000 UNITS capsule Take 1,000 Units by mouth daily.     Cyanocobalamin (VITAMIN B 12 PO) Take by mouth.     donepezil (ARICEPT) 10 MG tablet donepezil 10 mg tablet  Take 1 tablet every day by oral route.     escitalopram (LEXAPRO) 10 MG tablet Take 10 mg by mouth daily.     fish oil-omega-3 fatty acids 1000 MG capsule Take 1 g by mouth 2 (two) times daily.     folic acid (FOLVITE) 800 MCG tablet Take 800 mcg by mouth daily.     furosemide (LASIX) 20 MG tablet Take 20 mg by mouth daily as needed.     hydrocortisone 2.5 % ointment      memantine (NAMENDA) 10 MG tablet Take 10 mg by mouth 2 (two) times daily.     Multiple Vitamins-Minerals (CENTRUM SILVER PO) Take 1 capsule by mouth daily.  olmesartan (BENICAR) 20 MG tablet Take 10 mg by mouth daily. Take 1/2 of a 20mg  tablet once a day (Patient not taking: Reported on 08/25/2023)     pyridOXINE (VITAMIN B-6) 100 MG tablet Take 50 mg by mouth daily.     rosuvastatin (CRESTOR) 40 MG tablet Take 1 tablet by mouth once daily (Patient not taking: Reported on 09/01/2023) 90 tablet 3   tamsulosin (FLOMAX) 0.4 MG CAPS capsule Take 0.4 mg by mouth daily.     triamcinolone cream (KENALOG) 0.1 %      vitamin B-12 (CYANOCOBALAMIN) 250 MCG tablet Take 500 mcg by mouth daily.     No current facility-administered medications for this visit.    ROS:   General:  No weight loss, Fever, chills  HEENT: No recent headaches, no nasal bleeding, no visual changes, no sore throat  Neurologic: No dizziness, blackouts, seizures. No recent symptoms of stroke or mini- stroke. No recent episodes of slurred speech, or temporary blindness.  Cardiac: No recent episodes of chest pain/pressure, no shortness of breath at rest.  No  shortness of breath with exertion.  Denies history of atrial fibrillation or irregular heartbeat  Vascular: No history of rest pain in feet.  No history of claudication.  No history of non-healing ulcer, No history of DVT   Pulmonary: No home oxygen, no productive cough, no hemoptysis,  No asthma or wheezing  Musculoskeletal:  [ ]  Arthritis, [ ]  Low back pain,  [ ]  Joint pain  Hematologic:No history of hypercoagulable state.  No history of easy bleeding.  No history of anemia  Gastrointestinal: No hematochezia or melena,  No gastroesophageal reflux, no trouble swallowing  Urinary: [ ]  chronic Kidney disease, [ ]  on HD - [ ]  MWF or [ ]  TTHS, [ ]  Burning with urination, [ ]  Frequent urination, [ ]  Difficulty urinating;   Skin: No rashes  Psychological: No history of anxiety,  No history of depression   Physical Examination  Vitals:   09/01/23 1359  BP: 134/76  Pulse: 81  Resp: 20  Temp: 98.3 F (36.8 C)  TempSrc: Temporal  SpO2: 96%  Height: 5\' 11"  (1.803 m)    Body mass index is 31.74 kg/m.  General:  Alert and oriented, no acute distress HEENT: Normal Neck: No bruit or JVD Pulmonary: Clear to auscultation bilaterally Cardiac: Regular Rate and Rhythm without murmur Gastrointestinal: Soft, non-tender, non-distended, no mass, no scars Skin: No rash Extremity Pulses:  radial pulses bilaterally Musculoskeletal: No deformity or edema  Neurologic: Upper and lower extremity motor 5/5 and symmetric  DATA:  Right Carotid Findings:  +----------+--------+--------+--------+-------------------------+--------+           PSV cm/sEDV cm/sStenosisPlaque Description       Comments  +----------+--------+--------+--------+-------------------------+--------+  CCA Prox  67      11                                                 +----------+--------+--------+--------+-------------------------+--------+  CCA Mid   73      15                                                  +----------+--------+--------+--------+-------------------------+--------+  CCA Distal49  9               heterogenous                       +----------+--------+--------+--------+-------------------------+--------+  ICA Prox  361     91      60-79%  heterogenous and calcific          +----------+--------+--------+--------+-------------------------+--------+  ICA Mid   120     12                                                 +----------+--------+--------+--------+-------------------------+--------+  ICA Distal101     17                                                 +----------+--------+--------+--------+-------------------------+--------+  ECA      109     4                                                  +----------+--------+--------+--------+-------------------------+--------+   +----------+--------+-------+----------------+-------------------+           PSV cm/sEDV cmsDescribe        Arm Pressure (mmHG)  +----------+--------+-------+----------------+-------------------+  [id number]     3      Multiphasic, WNL                     +----------+--------+-------+----------------+-------------------+   +---------+--------+--+--------+--+---------+  VertebralPSV cm/s34EDV cm/s11Antegrade  +---------+--------+--+--------+--+---------+      Left Carotid Findings:  +----------+--------+--------+--------+------------------+--------+           PSV cm/sEDV cm/sStenosisPlaque DescriptionComments  +----------+--------+--------+--------+------------------+--------+  CCA Prox  113     17                                          +----------+--------+--------+--------+------------------+--------+  CCA Mid   78      17              heterogenous                +----------+--------+--------+--------+------------------+--------+  CCA Distal66      17              heterogenous                 +----------+--------+--------+--------+------------------+--------+  ICA Prox  61      14      1-39%   heterogenous                +----------+--------+--------+--------+------------------+--------+  ICA Mid   69      20                                          +----------+--------+--------+--------+------------------+--------+  ICA Distal79      21                                          +----------+--------+--------+--------+------------------+--------+  ECA      78      12                                          +----------+--------+--------+--------+------------------+--------+   +----------+--------+--------+----------------+-------------------+           PSV cm/sEDV cm/sDescribe        Arm Pressure (mmHG)  +----------+--------+--------+----------------+-------------------+  [id number]    5       Multiphasic, WNL                     +----------+--------+--------+----------------+-------------------+   +---------+--------+--+--------+--+---------+  VertebralPSV cm/s38EDV cm/s10Antegrade  +---------+--------+--+--------+--+---------+     Summary:  Right Carotid: Velocities in the right ICA are consistent with a 60-79%                 stenosis.   Left Carotid: Velocities in the left ICA are consistent with a 1-39%  stenosis.   Vertebrals: Bilateral vertebral arteries demonstrate antegrade flow.  Subclavians: Normal flow hemodynamics were seen in bilateral subclavian               arteries.     ASSESSMENT/PLAN: Asymptomatic Carotid stenosis  He remains asymptomatic for stroke/TIA symptoms.  The duplex is unchanged, if anything the PSV is less than the previous 60-79% stenosis on the right ICA and < 39% on the left.  He is not very active and does not always wear his compression.  Some days he sits in his PJ's all day his wife states.  I asked her to try and place the compression sock on daily and take off at noight even if  he doesn't get dressed.    We reviewed the symptoms of stroke/TIA and if these occur they will call 911.  Other wise he will return for surveillance carotid duplex in 6 months.        Mosetta Pigeon PA-C [facility] Office: [phone]  MD in clinic [place]

## 2023-09-04 ENCOUNTER — Other Ambulatory Visit: Payer: Self-pay | Admitting: *Deleted

## 2023-09-04 DIAGNOSIS — I6523 Occlusion and stenosis of bilateral carotid arteries: Secondary | ICD-10-CM

## 2023-09-05 ENCOUNTER — Telehealth: Payer: Self-pay | Admitting: Pulmonary Disease

## 2023-09-05 DIAGNOSIS — J42 Unspecified chronic bronchitis: Secondary | ICD-10-CM

## 2023-09-05 MED ORDER — BUDESONIDE 0.5 MG/2ML IN SUSP
0.5000 mg | Freq: Two times a day (BID) | RESPIRATORY_TRACT | 12 refills | Status: AC
Start: 2023-09-05 — End: ?

## 2023-09-05 NOTE — Telephone Encounter (Signed)
 Received fax from [facility] requesting refills for budesonide. Will send prescription electronically.  Melody Comas, MD [facility] Office: [phone]   See Amion for personal pager PCCM on call pager [phone] until 7pm. Please call Elink 7p-7a. [phone]

## 2023-10-28 ENCOUNTER — Other Ambulatory Visit (HOSPITAL_COMMUNITY): Payer: Self-pay | Admitting: Internal Medicine

## 2023-11-05 ENCOUNTER — Telehealth (HOSPITAL_COMMUNITY): Payer: Self-pay | Admitting: Cardiology

## 2023-11-05 DIAGNOSIS — I10 Essential (primary) hypertension: Secondary | ICD-10-CM

## 2023-11-05 NOTE — Telephone Encounter (Signed)
 Patients wife left VM on triage line with concerns regarding low b/p  dizziness and HA    Returned call for additional details No answer mailbox full   Wife returned call- Reports since stopping b/p medication pt has reported increase in HA and dizziness -readings at various times over the past few days 165/96 HR 62 143/71 HR 53 195/112 132/77  Denies CP or SOB  Pt wife reports he has not established with PCP  Please advise

## 2023-11-06 MED ORDER — LOSARTAN POTASSIUM 25 MG PO TABS: 12.5000 mg | ORAL_TABLET | Freq: Every day | ORAL | 3 refills | Status: AC

## 2023-11-06 NOTE — Telephone Encounter (Signed)
Pt aware via wife 

## 2023-11-07 ENCOUNTER — Other Ambulatory Visit (HOSPITAL_COMMUNITY): Payer: Self-pay | Admitting: Internal Medicine

## 2023-11-09 ENCOUNTER — Emergency Department (HOSPITAL_BASED_OUTPATIENT_CLINIC_OR_DEPARTMENT_OTHER)

## 2023-11-09 ENCOUNTER — Other Ambulatory Visit: Payer: Self-pay

## 2023-11-09 ENCOUNTER — Encounter (HOSPITAL_BASED_OUTPATIENT_CLINIC_OR_DEPARTMENT_OTHER): Payer: Self-pay

## 2023-11-09 ENCOUNTER — Inpatient Hospital Stay (HOSPITAL_BASED_OUTPATIENT_CLINIC_OR_DEPARTMENT_OTHER)
Admission: EM | Admit: 2023-11-09 | Discharge: 2023-11-12 | DRG: 065 | Disposition: A | Discharge status: Skilled Nursing Facility | Attending: Internal Medicine | Admitting: Internal Medicine

## 2023-11-09 DIAGNOSIS — I6523 Occlusion and stenosis of bilateral carotid arteries: Secondary | ICD-10-CM | POA: Diagnosis present

## 2023-11-09 DIAGNOSIS — I509 Heart failure, unspecified: Secondary | ICD-10-CM

## 2023-11-09 DIAGNOSIS — F0284 Dementia in other diseases classified elsewhere, unspecified severity, with anxiety: Secondary | ICD-10-CM | POA: Diagnosis present

## 2023-11-09 DIAGNOSIS — N1831 Chronic kidney disease, stage 3a: Secondary | ICD-10-CM | POA: Diagnosis present

## 2023-11-09 DIAGNOSIS — Z8249 Family history of ischemic heart disease and other diseases of the circulatory system: Secondary | ICD-10-CM

## 2023-11-09 DIAGNOSIS — F0283 Dementia in other diseases classified elsewhere, unspecified severity, with mood disturbance: Secondary | ICD-10-CM | POA: Diagnosis present

## 2023-11-09 DIAGNOSIS — Z955 Presence of coronary angioplasty implant and graft: Secondary | ICD-10-CM | POA: Diagnosis not present

## 2023-11-09 DIAGNOSIS — Z751 Person awaiting admission to adequate facility elsewhere: Secondary | ICD-10-CM | POA: Diagnosis not present

## 2023-11-09 DIAGNOSIS — E859 Amyloidosis, unspecified: Secondary | ICD-10-CM | POA: Diagnosis not present

## 2023-11-09 DIAGNOSIS — N183 Chronic kidney disease, stage 3 unspecified: Secondary | ICD-10-CM | POA: Diagnosis not present

## 2023-11-09 DIAGNOSIS — I5032 Chronic diastolic (congestive) heart failure: Secondary | ICD-10-CM | POA: Diagnosis present

## 2023-11-09 DIAGNOSIS — I130 Hypertensive heart and chronic kidney disease with heart failure and stage 1 through stage 4 chronic kidney disease, or unspecified chronic kidney disease: Secondary | ICD-10-CM | POA: Diagnosis present

## 2023-11-09 DIAGNOSIS — I619 Nontraumatic intracerebral hemorrhage, unspecified: Secondary | ICD-10-CM | POA: Diagnosis not present

## 2023-11-09 DIAGNOSIS — R29703 NIHSS score 3: Secondary | ICD-10-CM | POA: Diagnosis not present

## 2023-11-09 DIAGNOSIS — I611 Nontraumatic intracerebral hemorrhage in hemisphere, cortical: Secondary | ICD-10-CM | POA: Diagnosis present

## 2023-11-09 DIAGNOSIS — E66812 Obesity, class 2: Secondary | ICD-10-CM | POA: Diagnosis present

## 2023-11-09 DIAGNOSIS — I2510 Atherosclerotic heart disease of native coronary artery without angina pectoris: Secondary | ICD-10-CM | POA: Diagnosis present

## 2023-11-09 DIAGNOSIS — F32A Depression, unspecified: Secondary | ICD-10-CM | POA: Diagnosis present

## 2023-11-09 DIAGNOSIS — I10 Essential (primary) hypertension: Secondary | ICD-10-CM | POA: Diagnosis not present

## 2023-11-09 DIAGNOSIS — Z7982 Long term (current) use of aspirin: Secondary | ICD-10-CM | POA: Diagnosis not present

## 2023-11-09 DIAGNOSIS — I6503 Occlusion and stenosis of bilateral vertebral arteries: Secondary | ICD-10-CM | POA: Diagnosis present

## 2023-11-09 DIAGNOSIS — J42 Unspecified chronic bronchitis: Secondary | ICD-10-CM | POA: Diagnosis present

## 2023-11-09 DIAGNOSIS — R29702 NIHSS score 2: Secondary | ICD-10-CM | POA: Diagnosis present

## 2023-11-09 DIAGNOSIS — E785 Hyperlipidemia, unspecified: Secondary | ICD-10-CM | POA: Diagnosis present

## 2023-11-09 DIAGNOSIS — G4733 Obstructive sleep apnea (adult) (pediatric): Secondary | ICD-10-CM | POA: Diagnosis present

## 2023-11-09 DIAGNOSIS — G309 Alzheimer's disease, unspecified: Secondary | ICD-10-CM | POA: Diagnosis present

## 2023-11-09 DIAGNOSIS — Z801 Family history of malignant neoplasm of trachea, bronchus and lung: Secondary | ICD-10-CM

## 2023-11-09 DIAGNOSIS — Z6831 Body mass index (BMI) 31.0-31.9, adult: Secondary | ICD-10-CM

## 2023-11-09 DIAGNOSIS — Z79899 Other long term (current) drug therapy: Secondary | ICD-10-CM

## 2023-11-09 DIAGNOSIS — N400 Enlarged prostate without lower urinary tract symptoms: Secondary | ICD-10-CM | POA: Diagnosis present

## 2023-11-09 DIAGNOSIS — F0390 Unspecified dementia without behavioral disturbance: Secondary | ICD-10-CM | POA: Diagnosis not present

## 2023-11-09 DIAGNOSIS — I680 Cerebral amyloid angiopathy: Secondary | ICD-10-CM | POA: Diagnosis not present

## 2023-11-09 LAB — COMPREHENSIVE METABOLIC PANEL WITH GFR
ALT: 17 U/L (ref 0–44)
AST: 25 U/L (ref 15–41)
Albumin: 4.4 g/dL (ref 3.5–5.0)
Alkaline Phosphatase: 78 U/L (ref 38–126)
Anion gap: 14 (ref 5–15)
BUN: 19 mg/dL (ref 8–23)
CO2: 24 mmol/L (ref 22–32)
Calcium: 10.5 mg/dL — ABNORMAL HIGH (ref 8.9–10.3)
Chloride: 100 mmol/L (ref 98–111)
Creatinine, Ser: 1.57 mg/dL — ABNORMAL HIGH (ref 0.61–1.24)
GFR, Estimated: 42 mL/min — ABNORMAL LOW (ref >60)
Glucose, Bld: 121 mg/dL — ABNORMAL HIGH (ref 70–99)
Potassium: 4.3 mmol/L (ref 3.5–5.1)
Sodium: 138 mmol/L (ref 135–145)
Total Bilirubin: 0.8 mg/dL (ref 0.0–1.2)
Total Protein: 7.8 g/dL (ref 6.5–8.1)

## 2023-11-09 LAB — CBC
HCT: 47.1 % (ref 39.0–52.0)
Hemoglobin: 15.8 g/dL (ref 13.0–17.0)
MCH: 32.9 pg (ref 26.0–34.0)
MCHC: 33.5 g/dL (ref 30.0–36.0)
MCV: 98.1 fL (ref 80.0–100.0)
Platelets: 159 10*3/uL (ref 150–400)
RBC: 4.8 MIL/uL (ref 4.22–5.81)
RDW: 12.9 % (ref 11.5–15.5)
WBC: 9.6 10*3/uL (ref 4.0–10.5)
nRBC: 0 % (ref 0.0–0.2)

## 2023-11-09 LAB — TROPONIN T, HIGH SENSITIVITY
Troponin T High Sensitivity: 43 ng/L — ABNORMAL HIGH (ref <19)
Troponin T High Sensitivity: 40 ng/L — ABNORMAL HIGH (ref <19)

## 2023-11-09 LAB — MAGNESIUM: Magnesium: 2.3 mg/dL (ref 1.7–2.4)

## 2023-11-09 LAB — PRO BRAIN NATRIURETIC PEPTIDE: Pro Brain Natriuretic Peptide: 1162 pg/mL — ABNORMAL HIGH (ref <300.0)

## 2023-11-09 MED ORDER — CHLORHEXIDINE GLUCONATE CLOTH 2 % EX PADS
6.0000 | MEDICATED_PAD | Freq: Every day | CUTANEOUS | Status: DC
  Administered 2023-11-10 – 2023-11-12 (×3): 6 via TOPICAL

## 2023-11-09 MED ORDER — ACETAMINOPHEN 160 MG/5ML PO SOLN: 650.0000 mg | ORAL | Status: DC | PRN

## 2023-11-09 MED ORDER — STROKE: EARLY STAGES OF RECOVERY BOOK
Freq: Once | Status: AC
  Filled 2023-11-09: qty 1

## 2023-11-09 MED ORDER — ORAL CARE MOUTH RINSE: 15.0000 mL | OROMUCOSAL | Status: DC | PRN

## 2023-11-09 MED ORDER — IOHEXOL 350 MG/ML SOLN
75.0000 mL | Freq: Once | INTRAVENOUS | Status: AC | PRN
  Administered 2023-11-09: 75 mL via INTRAVENOUS

## 2023-11-09 MED ORDER — ACETAMINOPHEN 325 MG PO TABS
650.0000 mg | ORAL_TABLET | ORAL | Status: DC | PRN
  Administered 2023-11-10: 650 mg via ORAL
  Filled 2023-11-09: qty 2

## 2023-11-09 MED ORDER — CLEVIDIPINE BUTYRATE 0.5 MG/ML IV EMUL
0.0000 mg/h | INTRAVENOUS | Status: DC
  Administered 2023-11-09: 2 mg/h via INTRAVENOUS
  Administered 2023-11-10: 8 mg/h via INTRAVENOUS
  Filled 2023-11-09 (×2): qty 50
  Filled 2023-11-09: qty 100

## 2023-11-09 MED ORDER — PANTOPRAZOLE SODIUM 40 MG PO TBEC
40.0000 mg | DELAYED_RELEASE_TABLET | Freq: Every day | ORAL | Status: DC
  Administered 2023-11-10 – 2023-11-11 (×2): 40 mg via ORAL
  Filled 2023-11-09 (×2): qty 1

## 2023-11-09 MED ORDER — SENNOSIDES-DOCUSATE SODIUM 8.6-50 MG PO TABS
1.0000 | ORAL_TABLET | Freq: Two times a day (BID) | ORAL | Status: DC
  Administered 2023-11-10 – 2023-11-12 (×4): 1 via ORAL
  Filled 2023-11-09 (×4): qty 1

## 2023-11-09 MED ORDER — ACETAMINOPHEN 650 MG RE SUPP: 650.0000 mg | RECTAL | Status: DC | PRN

## 2023-11-09 NOTE — ED Triage Notes (Addendum)
 Patient arrives POV accompanied by daughter.  Patient reportedly has becoming increasingly dizzy and had 2 syncopal episodes with vomiting. Also reports some shortness of breath as well.

## 2023-11-09 NOTE — ED Provider Notes (Signed)
 [place] EMERGENCY DEPARTMENT AT [facility] Provider Note   CSN: [id number] Arrival date & time: 11/09/23  1743     History  Chief Complaint  Patient presents with   Dizziness   Loss of Consciousness    Chris Guzman is a 89 y.o. male.  With a history of Alzheimer's disease, CAD, hypertension, hyperlipidemia, carotid stenosis and obstructive sleep apnea who presents to the ED for vomiting and syncope.  Yesterday while at the dinner table patient had an episode of nausea with vomiting followed by transient syncope.  No seizure-like activity returned to baseline immediately.  No associated trauma.  He has been complaining of headache to his wife today.  No other syncopal episode earlier today.  Significantly fatigued compared to his baseline as well.  Patient himself does have a history of progressing Alzheimer's disease and is unable to provide additional clinical history at this time however he denies current headaches chest pain shortness of breath abdominal pain.  Wife notes recent medication changes.  There was some concern for hypotension and the patient was temporarily discontinued from antihypertensive but recently restarted on losartan .   Dizziness Associated symptoms: syncope   Loss of Consciousness Associated symptoms: dizziness        Home Medications Prior to Admission medications   Medication Sig Start Date End Date Taking? Authorizing Provider  ammonium lactate (AMLACTIN) 12 % cream ammonium lactate 12 % topical cream  APPLY TO DRY SKIN ON LEGS ARMS AND BACK ONCE TO TWICE DAILY AFTER SHOWERS (AVOID FACE OPEN CUTS AND GROIN)    [provider]  aspirin 81 MG tablet Take 81 mg by mouth daily.    [provider]  budesonide  (PULMICORT ) 0.5 MG/2ML nebulizer solution Take 2 mLs (0.5 mg total) by nebulization in the morning and at bedtime. 09/05/23   Wilfredo Hanly, MD  cefdinir (OMNICEF) 300 MG capsule Take 300 mg by mouth 2 (two) times  daily.    [provider]  Cholecalciferol (VITAMIN D) 1000 UNITS capsule Take 1,000 Units by mouth daily.    [provider]  Cyanocobalamin (VITAMIN B 12 PO) Take by mouth.    [provider]  donepezil (ARICEPT) 10 MG tablet donepezil 10 mg tablet  Take 1 tablet every day by oral route.    [provider]  escitalopram (LEXAPRO) 10 MG tablet Take 10 mg by mouth daily.    [provider]  fish oil-omega-3 fatty acids 1000 MG capsule Take 1 g by mouth 2 (two) times daily.    [provider]  folic acid (FOLVITE) 800 MCG tablet Take 800 mcg by mouth daily.    [provider]  furosemide (LASIX) 20 MG tablet Take 20 mg by mouth daily as needed.    [provider]  hydrocortisone 2.5 % ointment     [provider]  losartan  (COZAAR ) 25 MG tablet Take 0.5 tablets (12.5 mg total) by mouth daily. 11/06/23 02/04/24  Sheryl Donna, NP  memantine (NAMENDA) 10 MG tablet Take 10 mg by mouth 2 (two) times daily. 03/09/20   [provider]  Multiple Vitamins-Minerals (CENTRUM SILVER PO) Take 1 capsule by mouth daily.    [provider]  olmesartan (BENICAR) 20 MG tablet Take 10 mg by mouth daily. Take 1/2 of a 20mg  tablet once a day Patient not taking: Reported on 08/25/2023    [provider]  pyridOXINE (VITAMIN B-6) 100 MG tablet Take 50 mg by mouth daily.  [provider]  rosuvastatin  (CRESTOR ) 40 MG tablet Take 1 tablet by mouth once daily 11/07/23   Bensimhon, Daniel R, MD  tamsulosin (FLOMAX) 0.4 MG CAPS capsule Take 0.4 mg by mouth daily.    [provider]  triamcinolone cream (KENALOG) 0.1 %  04/03/20   [provider]  vitamin B-12 (CYANOCOBALAMIN) 250 MCG tablet Take 500 mcg by mouth daily.    [provider]      Allergies    Ace inhibitors    Review of Systems   Review of Systems  Cardiovascular:  Positive for syncope.  Neurological:  Positive for  dizziness.    Physical Exam Updated Vital Signs BP (!) 166/84   Pulse (!) 55   Temp 98.9 F (37.2 C)   Resp 16   Ht 5\' 11"  (1.803 m)   Wt 102.1 kg   SpO2 95%   BMI 31.38 kg/m  Physical Exam Vitals and nursing note reviewed.  HENT:     Head: Normocephalic and atraumatic.  Eyes:     Extraocular Movements: Extraocular movements intact.     Pupils: Pupils are equal, round, and reactive to light.     Comments: 2 mm equal round reactive to light  Cardiovascular:     Rate and Rhythm: Normal rate and regular rhythm.  Pulmonary:     Effort: Pulmonary effort is normal.     Breath sounds: Normal breath sounds.  Abdominal:     Palpations: Abdomen is soft.     Tenderness: There is no abdominal tenderness.  Skin:    General: Skin is warm and dry.  Neurological:     General: No focal deficit present.     Mental Status: He is alert.     Sensory: No sensory deficit.     Motor: No weakness.     Coordination: Coordination normal.     Comments: Oriented to self only Resting tremor  Psychiatric:        Mood and Affect: Mood normal.     ED Results / Procedures / Treatments   Labs (all labs ordered are listed, but only abnormal results are displayed) Labs Reviewed  COMPREHENSIVE METABOLIC PANEL WITH GFR - Abnormal; Notable for the following components:      Result Value   Glucose, Bld 121 (*)    Creatinine, Ser 1.57 (*)    Calcium  10.5 (*)    GFR, Estimated 42 (*)    All other components within normal limits  PRO BRAIN NATRIURETIC PEPTIDE - Abnormal; Notable for the following components:   Pro Brain Natriuretic Peptide 1,162.0 (*)    All other components within normal limits  TROPONIN T, HIGH SENSITIVITY - Abnormal; Notable for the following components:   Troponin T High Sensitivity 43 (*)    All other components within normal limits  CBC  MAGNESIUM  URINALYSIS, W/ REFLEX TO CULTURE (INFECTION SUSPECTED)  CBG MONITORING, ED  TROPONIN T, HIGH SENSITIVITY     EKG None  Radiology CT ANGIO HEAD NECK W WO CM Result Date: 11/09/2023 CLINICAL DATA:  +IPH. EXAM: CT ANGIOGRAPHY HEAD AND NECK WITH AND WITHOUT CONTRAST TECHNIQUE: Multidetector CT imaging of the head and neck was performed using the standard protocol during bolus administration of intravenous contrast. Multiplanar CT image reconstructions and MIPs were obtained to evaluate the vascular anatomy. Carotid stenosis measurements (when applicable) are obtained utilizing NASCET criteria, using the distal internal carotid diameter as the denominator. RADIATION DOSE REDUCTION: This exam was performed according to the departmental dose-optimization  program which includes automated exposure control, adjustment of the mA and/or kV according to patient size and/or use of iterative reconstruction technique. CONTRAST:  75mL OMNIPAQUE  IOHEXOL  350 MG/ML SOLN COMPARISON:  None Available. FINDINGS: CTA NECK FINDINGS Aortic arch: Great vessel origins are patent without significant stenosis. Aortic atherosclerosis. Right carotid system: Ulcerated atherosclerosis at the carotid bifurcation with approximately 70% stenosis of the ICA origin. Left carotid system: Atherosclerosis at the carotid bifurcation without greater than 50% stenosis. Tortuous ICA at the skull base with mild ectasia. Vertebral arteries: Severe bilateral vertebral artery origin stenosis due to atherosclerosis. Otherwise, vertebral arteries are patent without greater vision stenosis. Skeleton: No acute abnormality on limited assessment. Other neck: No acute abnormality on limited assessment. Upper chest: Visualized lung apices are clear. Review of the MIP images confirms the above findings CTA HEAD FINDINGS Anterior circulation: Bilateral intracranial ICAs are patent with mild for age atherosclerotic narrowing. Bilateral MCAs and ACAs are patent without proximal hemodynamically significant stenosis. Posterior circulation: Severe bilateral intradural  vertebral artery stenosis due to atherosclerosis. The basilar artery and bilateral posterior cerebral arteries are patent without proximal hemodynamically significant stenosis. Venous sinuses: As permitted by contrast timing, patent. Review of the MIP images confirms the above findings IMPRESSION: 1. No emergent large vessel occlusion. 2. Ulcerated atherosclerosis at the right carotid bifurcation with approximately 70% stenosis of the ICA origin. 3. Severe bilateral intradural vertebral artery stenosis. 4. Severe bilateral vertebral artery origin stenosis. 5. Aortic Atherosclerosis (ICD10-I70.0). Electronically Signed   By: Stevenson Elbe M.D.   On: 11/09/2023 20:50   CT Head Wo Contrast Result Date: 11/09/2023 CLINICAL DATA:  Head trauma, minor (Age >= 65y). Dizziness, syncope, vomiting. EXAM: CT HEAD WITHOUT CONTRAST TECHNIQUE: Contiguous axial images were obtained from the base of the skull through the vertex without intravenous contrast. RADIATION DOSE REDUCTION: This exam was performed according to the departmental dose-optimization program which includes automated exposure control, adjustment of the mA and/or kV according to patient size and/or use of iterative reconstruction technique. COMPARISON:  06/21/2022 FINDINGS: Brain: Small area of hemorrhage in the right temporal lobe measuring 1.5 x 1.2 cm. There is atrophy and chronic small vessel disease changes. No acute infarct or hydrocephalus. Vascular: No hyperdense vessel or unexpected calcification. Skull: No acute calvarial abnormality. Sinuses/Orbits: No acute findings Other: None IMPRESSION: Small area of small rounded hyperdense area in the right temporal lobe measuring 1.5 x 1.2 cm compatible with small acute hemorrhage. Atrophy, chronic small vessel disease. These results were called by telephone at the time of interpretation on 11/09/2023 at 7:08 pm to provider [facility] , who verbally acknowledged these results. Electronically Signed   By:  Janeece Mechanic M.D.   On: 11/09/2023 19:09   DG Chest Portable 1 View Result Date: 11/09/2023 CLINICAL DATA:  Dizziness, syncope, vomiting.  Shortness of breath. EXAM: PORTABLE CHEST 1 VIEW COMPARISON:  05/01/2009 FINDINGS: Heart and mediastinal contours are within normal limits. No focal opacities or effusions. No acute bony abnormality. Aortic atherosclerosis. IMPRESSION: No active disease. Electronically Signed   By: Janeece Mechanic M.D.   On: 11/09/2023 19:04    Procedures .Critical Care  Performed by: Sallyanne Creamer, DO Authorized by: Sallyanne Creamer, DO   Critical care provider statement:    Critical care time (minutes):  45   Critical care time was exclusive of:  Separately billable procedures and treating other patients and teaching time   Critical care was necessary to treat or prevent imminent or life-threatening deterioration of the following  conditions:  CNS failure or compromise   Critical care was time spent personally by me on the following activities:  Development of treatment plan with patient or surrogate, discussions with consultants, evaluation of patient's response to treatment, examination of patient, ordering and review of laboratory studies, ordering and review of radiographic studies, ordering and performing treatments and interventions, pulse oximetry, re-evaluation of patient's condition, review of old charts and obtaining history from patient or surrogate   I assumed direction of critical care for this patient from another provider in my specialty: no     Care discussed with: admitting provider   Comments:     Discussed with Dr. Alecia Ames (neurology     Medications Ordered in ED Medications  clevidipine (CLEVIPREX) infusion 0.5 mg/mL (4 mg/hr Intravenous Rate/Dose Change 11/09/23 2111)  iohexol  (OMNIPAQUE ) 350 MG/ML injection 75 mL (75 mLs Intravenous Contrast Given 11/09/23 1959)    ED Course/ Medical Decision Making/ A&P Clinical Course as of 11/09/23 2112   Sun Nov 09, 2023  1909 Call taken from radiology.  CT head reveals intraparenchymal hemorrhage in the right temporal lobe.  This patient is not anticoagulated.  Will page neurology [MP]  1919 Discussed with Dr. Alecia Ames (neurology) who recommend systolic blood pressure goal <150.  Management with Cleviprex if necessary.  CTA Head and neck.  Patient will be admitted to [facility] under neurology service.  Awaiting remainder of workup [MP]  2111 Repeated blood pressure with systolic over 150.  Will need to initiate Cleviprex here to keep tighter control blood pressures.  Critical care transport team has arrived in the ED [MP]    Clinical Course User Index [MP] Sallyanne Creamer, DO                                 Medical Decision Making 89 year old male with history as above presenting for nausea and vomiting and 2 syncopal episodes during the last 2 days.  Was reported headaches to his wife at home.  Denies any systemic complaints here but unable to contribute additional history secondary to Alzheimer's disease.  On my exam he is awake alert oriented with no focal neurologic deficit.  Oriented to self only.  No significant somnolence or concern for airway compromise.  No adventitious lung sounds on exam.  Presentation most concerning for acute infectious process such as pneumonia or UTI.  Given his extensive cardiac history and syncope will also obtain workup to look for dysrhythmia ACS.  Considering reported headaches and medication changes concern for potential hypertensive brain bleed as cause of his headaches.  Will obtain CT head without contrast and continue to monitor neurologic status here.  Admission likely.  Amount and/or Complexity of Data Reviewed Labs: ordered. Radiology: ordered.  Risk Prescription drug management. Decision regarding hospitalization.           Final Clinical Impression(s) / ED Diagnoses Final diagnoses:  Intraparenchymal hemorrhage of brain [facility])     Rx / DC Orders ED Discharge Orders     None         Sallyanne Creamer, DO 11/09/23 2112

## 2023-11-09 NOTE — ED Notes (Signed)
 Pt aware of the need for a urine... Unable to currently provide the sample.Marland KitchenMarland Kitchen

## 2023-11-09 NOTE — ED Notes (Signed)
 Pts family states Dementia.Chris AasAaron Guzman

## 2023-11-09 NOTE — Progress Notes (Signed)
 eLink Physician-Brief Progress Note Patient Name: Nickalos Petersen DOB: 11-13-1933 MRN: [id number]   Date of Service  11/09/2023  HPI/Events of Note  89 year old male with a history of Alzheimer's disease, coronary artery disease, and metabolic syndrome with critical stenosis presents with vomiting and syncope found to have an intraparenchymal hemorrhage in the right temporal lobe and admitted to the ICU for further monitoring.  Essentially normal vitals with 93% saturation on room air.  Currently on a clevidipine drip.  Laboratory studies show elevated creatinine, elevated BNP and imaging consistent with intraparenchymal hemorrhage.  eICU Interventions  Type blood.  Pressure control-clevidipine ordered, goal SBP 130-150  Neurology consulted, interval CTs/imaging per primary team.  DVT prophylaxis with SCDs GI prophylaxis with pantoprazole     Intervention Category Evaluation Type: New Patient Evaluation  Kristene Liberati 11/09/2023, 10:21 PM

## 2023-11-09 NOTE — ED Notes (Addendum)
 Family stated Pt is unsteady on his feet.... Sight problems... Dizziness... Headache... A couple of episodes of LOC over the last few days... No recent falls to note... Speech, sensory, limbs seem intact - family agreed... takes aspirin.Chris AasAaron Guzman

## 2023-11-09 NOTE — ED Notes (Signed)
 Report given to Floor RN

## 2023-11-09 NOTE — ED Notes (Signed)
 Report given to [facility].

## 2023-11-10 ENCOUNTER — Inpatient Hospital Stay (HOSPITAL_COMMUNITY)

## 2023-11-10 DIAGNOSIS — F0390 Unspecified dementia without behavioral disturbance: Secondary | ICD-10-CM

## 2023-11-10 DIAGNOSIS — I619 Nontraumatic intracerebral hemorrhage, unspecified: Secondary | ICD-10-CM

## 2023-11-10 DIAGNOSIS — I10 Essential (primary) hypertension: Secondary | ICD-10-CM

## 2023-11-10 DIAGNOSIS — I680 Cerebral amyloid angiopathy: Secondary | ICD-10-CM | POA: Diagnosis not present

## 2023-11-10 DIAGNOSIS — E859 Amyloidosis, unspecified: Secondary | ICD-10-CM | POA: Diagnosis not present

## 2023-11-10 DIAGNOSIS — R29703 NIHSS score 3: Secondary | ICD-10-CM | POA: Diagnosis not present

## 2023-11-10 DIAGNOSIS — Z7982 Long term (current) use of aspirin: Secondary | ICD-10-CM

## 2023-11-10 DIAGNOSIS — I2510 Atherosclerotic heart disease of native coronary artery without angina pectoris: Secondary | ICD-10-CM

## 2023-11-10 DIAGNOSIS — E785 Hyperlipidemia, unspecified: Secondary | ICD-10-CM

## 2023-11-10 LAB — URINALYSIS, W/ REFLEX TO CULTURE (INFECTION SUSPECTED)
Bilirubin Urine: NEGATIVE
Glucose, UA: NEGATIVE mg/dL
Ketones, ur: 5 mg/dL — AB
Leukocytes,Ua: NEGATIVE
Nitrite: NEGATIVE
Protein, ur: 100 mg/dL — AB
RBC / HPF: >50 RBC/hpf (ref 0–5)
Specific Gravity, Urine: 1.045 — ABNORMAL HIGH (ref 1.005–1.030)
pH: 6 (ref 5.0–8.0)

## 2023-11-10 LAB — ECHOCARDIOGRAM COMPLETE
AR max vel: 2.67 cm2
AV Area VTI: 3.04 cm2
AV Area mean vel: 2.66 cm2
AV Mean grad: 3 mmHg
AV Peak grad: 4.9 mmHg
Ao pk vel: 1.11 m/s
Area-P 1/2: 2.62 cm2
Height: 71 in
S' Lateral: 2.6 cm
Weight: 3600 [oz_av]

## 2023-11-10 LAB — BASIC METABOLIC PANEL WITH GFR
Anion gap: 10 (ref 5–15)
BUN: 16 mg/dL (ref 8–23)
CO2: 25 mmol/L (ref 22–32)
Calcium: 9.3 mg/dL (ref 8.9–10.3)
Chloride: 101 mmol/L (ref 98–111)
Creatinine, Ser: 1.3 mg/dL — ABNORMAL HIGH (ref 0.61–1.24)
GFR, Estimated: 53 mL/min — ABNORMAL LOW (ref >60)
Glucose, Bld: 114 mg/dL — ABNORMAL HIGH (ref 70–99)
Potassium: 4 mmol/L (ref 3.5–5.1)
Sodium: 136 mmol/L (ref 135–145)

## 2023-11-10 LAB — LIPID PANEL
Cholesterol: 149 mg/dL (ref 0–200)
HDL: 68 mg/dL (ref >40)
LDL Cholesterol: 74 mg/dL (ref 0–99)
Total CHOL/HDL Ratio: 2.2 ratio
Triglycerides: 33 mg/dL (ref <150)
VLDL: 7 mg/dL (ref 0–40)

## 2023-11-10 LAB — CBC
HCT: 46 % (ref 39.0–52.0)
Hemoglobin: 15.5 g/dL (ref 13.0–17.0)
MCH: 33 pg (ref 26.0–34.0)
MCHC: 33.7 g/dL (ref 30.0–36.0)
MCV: 97.9 fL (ref 80.0–100.0)
Platelets: 149 10*3/uL — ABNORMAL LOW (ref 150–400)
RBC: 4.7 MIL/uL (ref 4.22–5.81)
RDW: 12.9 % (ref 11.5–15.5)
WBC: 9.4 10*3/uL (ref 4.0–10.5)
nRBC: 0 % (ref 0.0–0.2)

## 2023-11-10 LAB — HEMOGLOBIN A1C
Hgb A1c MFr Bld: 5 % (ref 4.8–5.6)
Mean Plasma Glucose: 96.8 mg/dL

## 2023-11-10 LAB — MRSA NEXT GEN BY PCR, NASAL: MRSA by PCR Next Gen: NOT DETECTED

## 2023-11-10 MED ORDER — AMLODIPINE BESYLATE 10 MG PO TABS
10.0000 mg | ORAL_TABLET | Freq: Every day | ORAL | Status: DC
  Administered 2023-11-10 – 2023-11-12 (×3): 10 mg via ORAL
  Filled 2023-11-10 (×3): qty 1

## 2023-11-10 MED ORDER — HYDRALAZINE HCL 20 MG/ML IJ SOLN: 20.0000 mg | INTRAMUSCULAR | Status: DC | PRN

## 2023-11-10 MED ORDER — LABETALOL HCL 5 MG/ML IV SOLN: 20.0000 mg | INTRAVENOUS | Status: DC | PRN

## 2023-11-10 MED ORDER — TAMSULOSIN HCL 0.4 MG PO CAPS
0.4000 mg | ORAL_CAPSULE | Freq: Every day | ORAL | Status: DC
  Administered 2023-11-10 – 2023-11-12 (×3): 0.4 mg via ORAL
  Filled 2023-11-10 (×3): qty 1

## 2023-11-10 MED ORDER — ESCITALOPRAM OXALATE 10 MG PO TABS
10.0000 mg | ORAL_TABLET | Freq: Every day | ORAL | Status: DC
  Administered 2023-11-10 – 2023-11-12 (×3): 10 mg via ORAL
  Filled 2023-11-10 (×3): qty 1

## 2023-11-10 MED ORDER — DONEPEZIL HCL 10 MG PO TABS
10.0000 mg | ORAL_TABLET | Freq: Every day | ORAL | Status: DC
  Administered 2023-11-10 – 2023-11-11 (×2): 10 mg via ORAL
  Filled 2023-11-10 (×2): qty 1

## 2023-11-10 MED ORDER — LOSARTAN POTASSIUM 25 MG PO TABS
12.5000 mg | ORAL_TABLET | Freq: Every day | ORAL | Status: DC
  Administered 2023-11-10 – 2023-11-12 (×3): 12.5 mg via ORAL
  Filled 2023-11-10 (×3): qty 0.5

## 2023-11-10 MED ORDER — MEMANTINE HCL 10 MG PO TABS
10.0000 mg | ORAL_TABLET | Freq: Two times a day (BID) | ORAL | Status: DC
  Administered 2023-11-10 – 2023-11-12 (×5): 10 mg via ORAL
  Filled 2023-11-10 (×6): qty 1

## 2023-11-10 MED ORDER — BUDESONIDE 0.5 MG/2ML IN SUSP
0.5000 mg | Freq: Two times a day (BID) | RESPIRATORY_TRACT | Status: DC
  Administered 2023-11-10 – 2023-11-12 (×5): 0.5 mg via RESPIRATORY_TRACT
  Filled 2023-11-10 (×5): qty 2

## 2023-11-10 NOTE — Evaluation (Signed)
 Clinical/Bedside Swallow Evaluation Patient Details  Name: Chris Guzman MRN: [id number] Date of Birth: 26-Nov-1933  Today's Date: 11/10/2023 Time: SLP Start Time (ACUTE ONLY): 1020 SLP Stop Time (ACUTE ONLY): 1045 SLP Time Calculation (min) (ACUTE ONLY): 25 min  Past Medical History:  Past Medical History:  Diagnosis Date   CAD (coronary artery disease)    aborted inf STEMI on treadmill 9/10; PCI of RCA, DES to LAD  with PTCA  of D2 9/10; relook cath 11/10-patent LAD stent with small branch vessel and dis RCA disease   Carotid artery occlusion    CHF (congestive heart failure) (HCC)    Elevated PSA    HTN (hypertension)    Hyperlipidemia    OSA (obstructive sleep apnea)    probable   Past Surgical History: History reviewed. No pertinent surgical history. HPI:  Chris Guzman is a 89 y.o. male with hx of htn, hyperlipidemia who presents with dizziness for 2 days.  He has had a headache for about 2 weeks, and then on day prior to admission had an episode of syncope.  He has been "dizzy" since that time.  On day of admission he had another episode of nausea and vomiting syncope today.  Due to these symptoms, he sought care at [facility] where a CT scan was done demonstrating an intraparenchymal hematoma. MRI pending.    Assessment / Plan / Recommendation  Clinical Impression  Patient presents with a normal oropharyngeal swallow. Oral phase functional despite missing and poor dental condition with appropriate oral transit and clearance of bolus. No overt s/s of aspiration observed across consistencies. Family reports no dysphagia previously as well as what is currently baseline communication and cognitive status. Will place on a regular diet. No f/u needed for swallow and will defer formal cognitive-linguistic eval at this time. SLP Visit Diagnosis: Dysphagia, unspecified (R13.10)       Diet Recommendation Regular;Thin liquid    Liquid Administration via:  Cup;Straw Medication Administration: Whole meds with liquid Supervision: Patient able to self feed Compensations: Slow rate;Small sips/bites Postural Changes: Seated upright at 90 degrees    Other  Recommendations Oral Care Recommendations: Oral care BID    Recommendations for follow up therapy are one component of a multi-disciplinary discharge planning process, led by the attending physician.  Recommendations may be updated based on patient status, additional functional criteria and insurance authorization.  Follow up Recommendations No SLP follow up         Functional Status Assessment Patient has not had a recent decline in their functional status    Swallow Study   General HPI: Chris Guzman is a 89 y.o. male with hx of htn, hyperlipidemia who presents with dizziness for 2 days.  He has had a headache for about 2 weeks, and then on day prior to admission had an episode of syncope.  He has been "dizzy" since that time.  On day of admission he had another episode of nausea and vomiting syncope today.  Due to these symptoms, he sought care at [facility] where a CT scan was done demonstrating an intraparenchymal hematoma. MRI pending. Type of Study: Bedside Swallow Evaluation Previous Swallow Assessment: none Diet Prior to this Study: NPO Temperature Spikes Noted: No Respiratory Status: Room air History of Recent Intubation: No Behavior/Cognition: Alert;Cooperative;Pleasant mood;Confused;Requires cueing Oral Cavity Assessment: Within Functional Limits Oral Care Completed by SLP: No Oral Cavity - Dentition: Poor condition;Missing dentition Vision: Functional for self-feeding Self-Feeding Abilities: Able to feed self Patient Positioning: Upright in  bed Baseline Vocal Quality: Normal Volitional Cough: Strong Volitional Swallow: Able to elicit    Oral/Motor/Sensory Function Overall Oral Motor/Sensory Function: Within functional limits   Ice Chips Ice chips: Not  tested   Thin Liquid Thin Liquid: Within functional limits Presentation: Cup;Self Fed;Straw    Nectar Thick Nectar Thick Liquid: Not tested   Honey Thick Honey Thick Liquid: Not tested   Puree Puree: Within functional limits Presentation: Spoon   Solid     Solid: Within functional limits Presentation: Self Fed     Chris Mayol MA, CCC-SLP  Chris Guzman Chris Guzman 11/10/2023,10:50 AM

## 2023-11-10 NOTE — H&P (Signed)
 NEUROLOGY H&P NOTE   Date of service: Nov 10, 2023 Patient Name: Chris Guzman MRN:  [id number] DOB:  [birth year] Chief Complaint: "dizziness"  History of Present Illness  Olsen Mccutchan is a 89 y.o. male with hx of htn, hyperlipidemia who presents with dizziness for 2 days.  He has had a headache for about 2 weeks, and then yesterday had a episode of syncope.  He has been "dizzy" since that time.  He had another episode of nausea and vomiting syncope today.  Due to these symptoms, he sought care at [facility] where a CT scan was done demonstrating an intraparenchymal hematoma.   Last known well: Yesterday, midday Modified rankin score: 3-Moderate disability-requires help but walks WITHOUT assistance ICH Score: 1 tNKASE: Not offered due to ICH Thrombectomy: not offered due to ICH NIHSS components Score: Comment  1a Level of Conscious 0[x]  1[]  2[]  3[]      1b LOC Questions 0[]  1[]  2[x]       1c LOC Commands 0[x]  1[]  2[]       2 Best Gaze 0[x]  1[]  2[]       3 Visual 0[x]  1[]  2[]  3[]      4 Facial Palsy 0[x]  1[]  2[]  3[]      5a Motor Arm - left 0[x]  1[]  2[]  3[]  4[]  UN[]    5b Motor Arm - Right 0[x]  1[]  2[]  3[]  4[]  UN[]    6a Motor Leg - Left 0[x]  1[]  2[]  3[]  4[]  UN[]    6b Motor Leg - Right 0[x]  1[]  2[]  3[]  4[]  UN[]    7 Limb Ataxia 0[x]  1[]  2[]  UN[]      8 Sensory 0[x]  1[]  2[]  UN[]      9 Best Language 0[x]  1[]  2[]  3[]      10 Dysarthria 0[x]  1[]  2[]  UN[]      11 Extinct. and Inattention 0[x]  1[]  2[]       TOTAL: 2      Past History   Past Medical History:  Diagnosis Date   CAD (coronary artery disease)    aborted inf STEMI on treadmill 9/10; PCI of RCA, DES to LAD  with PTCA  of D2 9/10; relook cath 11/10-patent LAD stent with small branch vessel and dis RCA disease   Carotid artery occlusion    CHF (congestive heart failure) (HCC)    Elevated PSA    HTN (hypertension)    Hyperlipidemia    OSA (obstructive sleep apnea)    probable   History reviewed. No pertinent  surgical history. Family History  Problem Relation Age of Onset   Pulmonary embolism Father    Heart attack Father 63   Lung cancer Mother    Heart attack Sister    Social History   Socioeconomic History   Marital status: Married    Spouse name: Not on file   Number of children: Not on file   Years of education: Not on file   Highest education level: Not on file  Occupational History   Not on file  Tobacco Use   Smoking status: Never   Smokeless tobacco: Never  Vaping Use   Vaping status: Never Used  Substance and Sexual Activity   Alcohol use: No   Drug use: No   Sexual activity: Not on file  Other Topics Concern   Not on file  Social History Narrative   Not on file   Social Drivers of Health   Financial Resource Strain: Not on file  Food Insecurity: Not on file  Transportation Needs: Not on file  Physical Activity: Not on  file  Stress: Not on file  Social Connections: Not on file   Allergies  Allergen Reactions   Ace Inhibitors Cough    Medications   Medications Prior to Admission  Medication Sig Dispense Refill Last Dose/Taking   ammonium lactate (AMLACTIN) 12 % cream ammonium lactate 12 % topical cream  APPLY TO DRY SKIN ON LEGS ARMS AND BACK ONCE TO TWICE DAILY AFTER SHOWERS (AVOID FACE OPEN CUTS AND GROIN)      aspirin 81 MG tablet Take 81 mg by mouth daily.      budesonide  (PULMICORT ) 0.5 MG/2ML nebulizer solution Take 2 mLs (0.5 mg total) by nebulization in the morning and at bedtime. 120 mL 12    cefdinir (OMNICEF) 300 MG capsule Take 300 mg by mouth 2 (two) times daily.      Cholecalciferol (VITAMIN D) 1000 UNITS capsule Take 1,000 Units by mouth daily.      Cyanocobalamin (VITAMIN B 12 PO) Take by mouth.      donepezil  (ARICEPT ) 10 MG tablet donepezil  10 mg tablet  Take 1 tablet every day by oral route.      escitalopram  (LEXAPRO ) 10 MG tablet Take 10 mg by mouth daily.      fish oil-omega-3 fatty acids 1000 MG capsule Take 1 g by mouth 2 (two)  times daily.      folic acid (FOLVITE) 800 MCG tablet Take 800 mcg by mouth daily.      furosemide (LASIX) 20 MG tablet Take 20 mg by mouth daily as needed.      hydrocortisone 2.5 % ointment       losartan  (COZAAR ) 25 MG tablet Take 0.5 tablets (12.5 mg total) by mouth daily. 45 tablet 3    memantine  (NAMENDA ) 10 MG tablet Take 10 mg by mouth 2 (two) times daily.      Multiple Vitamins-Minerals (CENTRUM SILVER PO) Take 1 capsule by mouth daily.      olmesartan (BENICAR) 20 MG tablet Take 10 mg by mouth daily. Take 1/2 of a 20mg  tablet once a day (Patient not taking: Reported on 08/25/2023)      pyridOXINE (VITAMIN B-6) 100 MG tablet Take 50 mg by mouth daily.      rosuvastatin  (CRESTOR ) 40 MG tablet Take 1 tablet by mouth once daily 90 tablet 3    tamsulosin  (FLOMAX ) 0.4 MG CAPS capsule Take 0.4 mg by mouth daily.      triamcinolone cream (KENALOG) 0.1 %       vitamin B-12 (CYANOCOBALAMIN) 250 MCG tablet Take 500 mcg by mouth daily.        Vitals   Vitals:   11/09/23 2315 11/09/23 2330 11/09/23 2345 11/10/23 0000  BP: 130/83 123/79 (!) 151/74 120/84  Pulse: (!) 117 (!) 57 (!) 59 (!) 56  Resp: 18 13 18 18   Temp:      TempSrc:      SpO2: (!) 76% 93% 94% 94%  Weight:      Height:           Physical Exam   Constitutional: Appears well-developed and well-nourished.   Neurologic Examination   Neuro: Mental Status: Patient is awake, alert, he is able to answer questions and name objects readily, but is not oriented to month or year which daughter states is baseline Cranial Nerves: II: Visual Fields are full. Pupils are equal, round, and reactive to light.   III,IV, VI: EOMI without ptosis or diploplia.  V: Facial sensation is symmetric to temperature VII: Facial movement is  symmetric.  VIII: hearing is intact to voice X: Uvula elevates symmetrically XII: tongue is midline without atrophy or fasciculations.  Motor: Tone is normal. Bulk is normal. 5/5 strength was present in  all four extremities.  Sensory: Sensation is symmetric to light touch and temperature in the arms and legs. Cerebellar: No clear ataxia finger-nose-finger       Labs   CBC:  Recent Labs  Lab 11/09/23 1811  WBC 9.6  HGB 15.8  HCT 47.1  MCV 98.1  PLT 159    Basic Metabolic Panel:  Lab Results  Component Value Date   NA 138 11/09/2023   K 4.3 11/09/2023   CO2 24 11/09/2023   GLUCOSE 121 (H) 11/09/2023   BUN 19 11/09/2023   CREATININE 1.57 (H) 11/09/2023   CALCIUM  10.5 (H) 11/09/2023   GFRNONAA 42 (L) 11/09/2023   GFRAA 59 (L) 04/15/2019   Lipid Panel:  Lab Results  Component Value Date   LDLCALC 55 04/15/2019   HgbA1c:  Lab Results  Component Value Date   HGBA1C  03/21/2009    5.4 (NOTE) The ADA recommends the following therapeutic goal for glycemic control related to Hgb A1c measurement: Goal of therapy: <6.5 Hgb A1c  Reference: American Diabetes Association: Clinical Practice Recommendations 2010, Diabetes Care, 2010, 33: (Suppl  1).   Urine Drug Screen:     Component Value Date/Time   LABOPIA NEGATIVE 03/21/2009 0609   COCAINSCRNUR NEGATIVE 03/21/2009 0609   LABBENZ NEGATIVE 03/21/2009 0609   AMPHETMU NEGATIVE 03/21/2009 0609    Alcohol Level No results found for: "ETH" INR  Lab Results  Component Value Date   INR 0.98 05/01/2009   APTT  Lab Results  Component Value Date   APTT 35 05/01/2009     CT Head without contrast(Personally reviewed): Small intraparenchymal hematoma in the left   Impression   Keven Osborn is a 89 y.o. male with history of hypertension, hyperlipidemia, dementia who presents with small intraparenchymal hematoma.  Location is unusual for hypertensive hemorrhage, but this does remain in the differential.  Also possible would be amyloid angiopathy (especially considering his history of dementia), hemorrhagic conversion of an ischemic infarct.  He will need an MRI to help further elucidate etiology.  Primary Diagnosis:   Nontraumatic intracerebral hemorrhage in hemisphere, cortical  Secondary Diagnosis: Essential (primary) hypertension, Heart failure, unspecified, and CKD Stage 3 (GFR 30-59)  Recommendations  1) Admit to ICU 2) no antiplatelets or anticoagulants 3) blood pressure control with goal systolic 130 - 150 4) Frequent neuro checks 5) If symptoms worsen or there is decreased mental status, repeat stat head CT 6) PT,OT,ST 7) MRI brain ______________________________________________________________________   Signed,  Ann Keto, MD [facility]

## 2023-11-10 NOTE — Progress Notes (Signed)
 Spoke with MRI tech Polly Brink regarding patients 0600 routine MRI scan. MRI unable to accommodate patient at this time due to multiple STAT scans ahead of patient. This RN asked MRI to call back as soon as they could take him in the scanner. Will report off to day shift RN if unable to get scan before 0700.   Care ongoing.   Sonna Dus, RN

## 2023-11-10 NOTE — TOC CM/SW Note (Signed)
 Transition of Care [facility]) - Inpatient Brief Assessment   Patient Details  Name: Chris Guzman MRN: [id number] Date of Birth: 04-Mar-1934  Transition of [facility]) CM/SW Contact:    Saivon Prowse M, RN Phone Number: 11/10/2023, 5:08 PM   Clinical Narrative: The pt is an 89 yo male presenting 5/18 after x2 syncopal episodes and dizziness. CT shows intraparenchymal hematoma, MRI pending.  Prior to admission, patient requires assistance with ADLs and ambulates with rolling walker; he lives with his wife, and has a HHA 3 days a week. TOC will continue to follow patient progress; please consult should any needs arise.    Transition of Care Asessment: Insurance and Status: Insurance coverage has been reviewed Patient has primary care physician: Yes (Dr. Dawne Euler) Home environment has been reviewed: Lives with spouse: HHA 3 days/week Prior level of function:: Requires assistance Prior/Current Home Services: Current home services Social Drivers of Health Review: SDOH reviewed no interventions necessary Readmission risk has been reviewed: Yes Transition of care needs: transition of care needs identified, TOC will continue to follow  Calla Catchings, RN, BSN  Trauma/Neuro ICU Case Manager [phone]

## 2023-11-10 NOTE — Progress Notes (Addendum)
 Stroke SBP goal is 130-150. MD made aware of 120's SBP current blood pressure trend-okay with that.  Current mentation the same. NIH 3.

## 2023-11-10 NOTE — Evaluation (Signed)
 Occupational Therapy Evaluation Patient Details Name: Chris Guzman MRN: [id number] DOB: [birth year] Today's Date: 11/10/2023   History of Present Illness   The pt is an 89 yo male presenting 5/18 after x2 syncopal episodes and dizziness. CT shows intraparenchymal hematoma, MRI pending. PMH includes: Alzheimer's dementia, CAD, HTN, HLD, carotid stenosis, and OSA.     Clinical Impressions PTA, pt lived with wife and received assist with ADL and IADL. Upon eval, pt needing min-mod A for transfers and up to max A for ADL. Suspect cognition to be near baseline, but pt with greater need for assist with all STS transfers with notable generalized weakness. Pt needing up to mod A for transfers although previously CGA, and wife reports recent back injury of her own. Due to significant change in functional status and limited assist at home, Patient will benefit from continued inpatient follow up therapy, <3 hours/day      If plan is discharge home, recommend the following:   A lot of help with walking and/or transfers;Two people to help with walking and/or transfers;A lot of help with bathing/dressing/bathroom;Assistance with cooking/housework;Direct supervision/assist for medications management;Direct supervision/assist for financial management;Assist for transportation;Help with stairs or ramp for entrance;Supervision due to cognitive status     Functional Status Assessment   Patient has had a recent decline in their functional status and demonstrates the ability to make significant improvements in function in a reasonable and predictable amount of time.     Equipment Recommendations   Other (comment) (defer)     Recommendations for Other Services         Precautions/Restrictions   Precautions Precautions: Fall;Other (comment) (delirium) Recall of Precautions/Restrictions: Impaired Precaution/Restrictions Comments: cues for safety with transfers and RW use. Restrictions Weight  Bearing Restrictions Per Provider Order: No     Mobility Bed Mobility Overal bed mobility: Needs Assistance Bed Mobility: Supine to Sit, Sit to Supine     Supine to sit: Mod assist Sit to supine: Mod assist   General bed mobility comments: cues for sequencing throughout with assist for trunk and scooting out toward EOB, as well as bringing BLE back into bed.    Transfers Overall transfer level: Needs assistance Equipment used: Rolling walker (2 wheels) Transfers: Sit to/from Stand, Bed to chair/wheelchair/BSC Sit to Stand: Min assist, Mod assist, +2 physical assistance, +2 safety/equipment Stand pivot transfers: Min assist, +2 safety/equipment         General transfer comment: min A initially to rise, but up to mod A for balance and power up esp with transition of BUE up to RW from bed. direct cues for hand placement      Balance Overall balance assessment: Needs assistance Sitting-balance support: No upper extremity supported, Feet supported Sitting balance-Leahy Scale: Fair Sitting balance - Comments: unable to accept any challenge   Standing balance support: During functional activity, Bilateral upper extremity supported Standing balance-Leahy Scale: Poor Standing balance comment: reliant on device and external support                           ADL either performed or assessed with clinical judgement   ADL Overall ADL's : Needs assistance/impaired Eating/Feeding: Set up;Bed level   Grooming: Minimal assistance;Bed level Grooming Details (indicate cue type and reason): due to intermittent cues for problem solving Upper Body Bathing: Minimal assistance;Sitting   Lower Body Bathing: Maximal assistance;Sitting/lateral leans;Sit to/from stand   Upper Body Dressing : Minimal assistance;Sitting   Lower Body Dressing: Sitting/lateral leans;Moderate  assistance Lower Body Dressing Details (indicate cue type and reason): to don slip on shoes Toilet Transfer:  +2 for physical assistance;Minimal assistance;+2 for safety/equipment;Ambulation;Rolling walker (2 wheels) Toilet Transfer Details (indicate cue type and reason): chair follow, assist for balance and management of RW         Functional mobility during ADLs: Minimal assistance;Rolling walker (2 wheels)       Vision Baseline Vision/History: 0 No visual deficits Ability to See in Adequate Light: 0 Adequate Patient Visual Report: No change from baseline Additional Comments: no change from baseline, not formally assessed     Perception Perception: Not tested       Praxis Praxis: Not tested       Pertinent Vitals/Pain Pain Assessment Pain Assessment: No/denies pain     Extremity/Trunk Assessment Upper Extremity Assessment Upper Extremity Assessment: Generalized weakness;Right hand dominant   Lower Extremity Assessment Lower Extremity Assessment: Defer to PT evaluation       Communication Communication Communication: Impaired Factors Affecting Communication:  (increased time for word finding)   Cognition Arousal: Alert Behavior During Therapy: Flat affect, WFL for tasks assessed/performed Cognition: History of cognitive impairments             OT - Cognition Comments: dementia at baseline, family reports at or near baseline. pt needing cues for orientation but able to give related information, difficulty with word finding intermittently. pt needing increased time and intermittent multimodal cues to follow commands.                 Following commands: Impaired Following commands impaired: Only follows one step commands consistently, Follows one step commands with increased time     Cueing  General Comments   Cueing Techniques: Verbal cues;Gestural cues;Tactile cues  VSS during session with BP stable   Exercises     Shoulder Instructions      Home Living Family/patient expects to be discharged to:: Private residence Living Arrangements:  Spouse/significant other Available Help at Discharge: Family Type of Home: House Home Access: Stairs to enter           [employer] Shower/Tub: Arts development officer Toilet: Standard     Home Equipment: Shower seat;Grab bars - tub/shower;Hand held Programmer, systems (2 wheels);Cane - single point          Prior Functioning/Environment Prior Level of Function : Needs assist             Mobility Comments: external support and RW ADLs Comments: per wife, pt needs assist with dressing and bathing    OT Problem List: Decreased strength;Decreased activity tolerance;Impaired balance (sitting and/or standing);Decreased coordination;Decreased cognition;Decreased safety awareness;Decreased knowledge of use of DME or AE;Decreased knowledge of precautions   OT Treatment/Interventions: Self-care/ADL training;Therapeutic exercise;DME and/or AE instruction;Therapeutic activities;Patient/family education;Balance training;Cognitive remediation/compensation      OT Goals(Current goals can be found in the care plan section)   Acute Rehab OT Goals Patient Stated Goal: pt did not state OT Goal Formulation: With patient Time For Goal Achievement: 11/24/23 Potential to Achieve Goals: Good   OT Frequency:  Min 1X/week    Co-evaluation              AM-PAC OT "6 Clicks" Daily Activity     Outcome Measure Help from another person eating meals?: A Little Help from another person taking care of personal grooming?: A Little Help from another person toileting, which includes using toliet, bedpan, or urinal?: A Lot Help from another person bathing (including washing, rinsing, drying)?:  A Lot Help from another person to put on and taking off regular upper body clothing?: A Little Help from another person to put on and taking off regular lower body clothing?: A Lot 6 Click Score: 15   End of Session Equipment Utilized During Treatment: Gait belt;Rolling walker (2  wheels) Nurse Communication: Mobility status;Other (comment) (RN requests not to leave in chair)  Activity Tolerance: Patient tolerated treatment well Patient left: in bed;with call bell/phone within reach;with bed alarm set;with family/visitor present  OT Visit Diagnosis: Unsteadiness on feet (R26.81);Muscle weakness (generalized) (M62.81);Other symptoms and signs involving cognitive function;History of falling (Z91.81)                Time: 1610-9604 OT Time Calculation (min): 40 min Charges:  OT General Charges $OT Visit: 1 Visit OT Evaluation $OT Eval Moderate Complexity: 1 Mod OT Treatments $Self Care/Home Management : 8-22 mins  Karilyn Ouch, OTR/L [facility] Acute Rehabilitation Office: [phone]   Chris Guzman 11/10/2023, 2:22 PM

## 2023-11-10 NOTE — Progress Notes (Addendum)
 STROKE TEAM PROGRESS NOTE    INTERIM HISTORY/SUBJECTIVE Patient with known history of dementia who presented with dizziness for 2 days as well as headaches for few weeks and altered mental status.  CT scan shows a small right temporal parenchymal hemorrhage. Family at bedside. Thoroughly discussed assessment and plan of care.   On exam, patient shows confusion which is mostly at his baseline, no focal weakness, no aphasia.  Blood pressure adequately controlled.  He is off Cleviprex  drip from this morning OBJECTIVE  CBC    Component Value Date/Time   WBC 9.6 11/09/2023 1811   RBC 4.80 11/09/2023 1811   HGB 15.8 11/09/2023 1811   HCT 47.1 11/09/2023 1811   PLT 159 11/09/2023 1811   MCV 98.1 11/09/2023 1811   MCH 32.9 11/09/2023 1811   MCHC 33.5 11/09/2023 1811   RDW 12.9 11/09/2023 1811   LYMPHSABS 1.2 05/01/2009 0210   MONOABS 0.7 05/01/2009 0210   EOSABS 0.1 05/01/2009 0210   BASOSABS 0.0 05/01/2009 0210    BMET    Component Value Date/Time   NA 138 11/09/2023 1811   K 4.3 11/09/2023 1811   CL 100 11/09/2023 1811   CO2 24 11/09/2023 1811   GLUCOSE 121 (H) 11/09/2023 1811   BUN 19 11/09/2023 1811   CREATININE 1.57 (H) 11/09/2023 1811   CALCIUM  10.5 (H) 11/09/2023 1811   CALCIUM  10.2 08/19/2023 0000   GFRNONAA 42 (L) 11/09/2023 1811    IMAGING past 24 hours CT ANGIO HEAD NECK W WO CM Result Date: 11/09/2023 CLINICAL DATA:  +IPH. EXAM: CT ANGIOGRAPHY HEAD AND NECK WITH AND WITHOUT CONTRAST TECHNIQUE: Multidetector CT imaging of the head and neck was performed using the standard protocol during bolus administration of intravenous contrast. Multiplanar CT image reconstructions and MIPs were obtained to evaluate the vascular anatomy. Carotid stenosis measurements (when applicable) are obtained utilizing NASCET criteria, using the distal internal carotid diameter as the denominator. RADIATION DOSE REDUCTION: This exam was performed according to the departmental  dose-optimization program which includes automated exposure control, adjustment of the mA and/or kV according to patient size and/or use of iterative reconstruction technique. CONTRAST:  75mL OMNIPAQUE  IOHEXOL  350 MG/ML SOLN COMPARISON:  None Available. FINDINGS: CTA NECK FINDINGS Aortic arch: Great vessel origins are patent without significant stenosis. Aortic atherosclerosis. Right carotid system: Ulcerated atherosclerosis at the carotid bifurcation with approximately 70% stenosis of the ICA origin. Left carotid system: Atherosclerosis at the carotid bifurcation without greater than 50% stenosis. Tortuous ICA at the skull base with mild ectasia. Vertebral arteries: Severe bilateral vertebral artery origin stenosis due to atherosclerosis. Otherwise, vertebral arteries are patent without greater vision stenosis. Skeleton: No acute abnormality on limited assessment. Other neck: No acute abnormality on limited assessment. Upper chest: Visualized lung apices are clear. Review of the MIP images confirms the above findings CTA HEAD FINDINGS Anterior circulation: Bilateral intracranial ICAs are patent with mild for age atherosclerotic narrowing. Bilateral MCAs and ACAs are patent without proximal hemodynamically significant stenosis. Posterior circulation: Severe bilateral intradural vertebral artery stenosis due to atherosclerosis. The basilar artery and bilateral posterior cerebral arteries are patent without proximal hemodynamically significant stenosis. Venous sinuses: As permitted by contrast timing, patent. Review of the MIP images confirms the above findings IMPRESSION: 1. No emergent large vessel occlusion. 2. Ulcerated atherosclerosis at the right carotid bifurcation with approximately 70% stenosis of the ICA origin. 3. Severe bilateral intradural vertebral artery stenosis. 4. Severe bilateral vertebral artery origin stenosis. 5. Aortic Atherosclerosis (ICD10-I70.0). Electronically Signed   By:  Stevenson Elbe  M.D.   On: 11/09/2023 20:50   CT Head Wo Contrast Result Date: 11/09/2023 CLINICAL DATA:  Head trauma, minor (Age >= 65y). Dizziness, syncope, vomiting. EXAM: CT HEAD WITHOUT CONTRAST TECHNIQUE: Contiguous axial images were obtained from the base of the skull through the vertex without intravenous contrast. RADIATION DOSE REDUCTION: This exam was performed according to the departmental dose-optimization program which includes automated exposure control, adjustment of the mA and/or kV according to patient size and/or use of iterative reconstruction technique. COMPARISON:  06/21/2022 FINDINGS: Brain: Small area of hemorrhage in the right temporal lobe measuring 1.5 x 1.2 cm. There is atrophy and chronic small vessel disease changes. No acute infarct or hydrocephalus. Vascular: No hyperdense vessel or unexpected calcification. Skull: No acute calvarial abnormality. Sinuses/Orbits: No acute findings Other: None IMPRESSION: Small area of small rounded hyperdense area in the right temporal lobe measuring 1.5 x 1.2 cm compatible with small acute hemorrhage. Atrophy, chronic small vessel disease. These results were called by telephone at the time of interpretation on 11/09/2023 at 7:08 pm to provider [facility] , who verbally acknowledged these results. Electronically Signed   By: Janeece Mechanic M.D.   On: 11/09/2023 19:09   DG Chest Portable 1 View Result Date: 11/09/2023 CLINICAL DATA:  Dizziness, syncope, vomiting.  Shortness of breath. EXAM: PORTABLE CHEST 1 VIEW COMPARISON:  05/01/2009 FINDINGS: Heart and mediastinal contours are within normal limits. No focal opacities or effusions. No acute bony abnormality. Aortic atherosclerosis. IMPRESSION: No active disease. Electronically Signed   By: Janeece Mechanic M.D.   On: 11/09/2023 19:04    Vitals:   11/10/23 0900 11/10/23 0915 11/10/23 0930 11/10/23 0945  BP: 139/71 126/79 109/75 (!) 122/111  Pulse: 62 63 64 61  Resp: 12 15 17 17   Temp:      TempSrc:       SpO2: 92% 96% 94% 93%  Weight:      Height:         PHYSICAL EXAM General:  Alert, well-nourished, well-developed patient in no acute distress Psych:  Mood and affect appropriate for situation CV: Regular rate and rhythm on monitor Respiratory:  Regular, unlabored respirations on room air GI: Abdomen soft and nontender   NEURO:   Oriented to name, age, place,  Not oriented to month year at baseline No dysarthria. No aphasia. Unable to repeat full sentence   Can identify watch, pen.Follows commands.  Cranial Nerves:  II: PERRL. Visual fields full.  III, IV, VI: EOMI. Eyelids elevate symmetrically.  V: Sensation is intact to light touch and symmetrical to face.  VII: Face is symmetrical resting and smiling VIII: hearing intact to voice. IX, X: Palate elevates symmetrically. Phonation is normal.  WU:[id number] shrug 5/5. XII: tongue is midline without fasciculations. Motor: 5/5 strength to all muscle groups tested.  Tone: is normal and bulk is normal Sensation- Intact to light touch bilaterally. Extinction absent to light touch to DSS.   Coordination: FTN intact bilaterally, HKS: no ataxia in BLE.No drift.  Gait- deferred  Most Recent NIH: 3     ASSESSMENT/PLAN Chris Guzman is a 89 y.o. male with history of hypertension, hyperlipidemia, dementia who presents with small intraparenchymal hematoma  NIH on Admission: 2.   Intracerebral Hemorrhage:  right temporal  Etiology:-Hypertensive versus amyloid angiopathy in patient with baseline dementia  CT head : Small acute hemorrhage right temporal lobe, measuring 1.5x1.2cm.  CTA head & neck  No LVO Ulcerated atherosclerosis at the right carotid bifurcation with approximately 70%  stenosis of the ICA origin Severe bilateral intradural vertebral artery stenosis  Severe bilateral vertebral artery origin stenosis.  MRI   Early subacute intra-axial hemorrhage in the right temporal lobe with estimated blood volume of 1 mL.   Mild regional edema, no significant mass effect or complicating features. 2D Echo: EF 60-65%, Mild LVH, borderline aortic root dilatation LDL 74 HgbA1c 5.0 VTE prophylaxis - SCDs aspirin 81 mg daily prior to admission, now on No antithrombotic secondary to ICH.  Therapy recommendations:  SNF Disposition:  pending facility selection  Hypertension Coronary artery disease S/p DES to LAD 2010 Home meds:  losartan  12.5mg  daily, amlodipine  10mg  daily, lasix 20mg  daily Stable Blood Pressure Goal: SBP between 130-150 for 24 hours and then less than 160   Hyperlipidemia Home meds:  Crestor  40mg  LDL 74., goal < 70 Continue statin at discharge  Other Stroke Risk Factors Obesity, Body mass index is 31.38 kg/m., BMI >/= 30 associated with increased stroke risk, recommend weight loss, diet and exercise as appropriate   Obstructive sleep apnea, not on CPAP at home   Other Active Problems Hx of Dementia Namenda  and Aricept  PTA meds, continued   Hospital day # 1   Pt seen by Neuro NP/APP and later by MD. Note/plan to be edited by MD as needed.    Audrene Lease, DNP [facility] Please use AMION for contact information & EPIC for messaging.  I have personally obtained history,examined this patient, reviewed notes, independently viewed imaging studies, participated in medical decision making and plan of care.ROS completed by me personally and pertinent positives fully documented  I have made any additions or clarifications directly to the above note. Agree with note above.  Patient with baseline dementia presented with small right temporal parenchymal intracerebral hemorrhage possibly from hypertensive versus amyloid angiopathy etiology.  Recommend close neurological observation and strict blood pressure control with systolic goal between 130-150 for the first 24 hours and then below 160.  Mobilize out of bed.  Therapy consults.  Hold antiplatelets.  Long discussion with patient  and wife at the bedside as well as daughter and son-in-law and answered questions. This patient is critically ill and at significant risk of neurological worsening, death and care requires constant monitoring of vital signs, hemodynamics,respiratory and cardiac monitoring, extensive review of multiple databases, frequent neurological assessment, discussion with family, other specialists and medical decision making of high complexity.I have made any additions or clarifications directly to the above note.This critical care time does not reflect procedure time, or teaching time or supervisory time of PA/NP/Med Resident etc but could involve care discussion time.  I spent 30 minutes of neurocritical care time  in the care of  this patient.      Ardella Beaver, MD Medical Director [facility] Pager: [phone] 11/10/2023 5:29 PM    To contact Stroke Continuity provider, please refer to [email or web address]. After hours, contact General Neurology

## 2023-11-10 NOTE — Evaluation (Signed)
 Physical Therapy Evaluation Patient Details Name: Chris Guzman MRN: [id number] DOB: March 29, 1934 Today's Date: 11/10/2023  History of Present Illness  The pt is an 89 yo male presenting 5/18 after x2 syncopal episodes and dizziness. CT shows intraparenchymal hematoma, MRI pending. PMH includes: Alzheimer's dementia, CAD, HTN, HLD, carotid stenosis, and OSA.  Clinical Impression  Pt in bed upon arrival of PT, agreeable to evaluation at this time. Prior to admission the pt was ambulating short distances with use of RW at the house, wife reports mostly in bed, but was able to ambulate to the bathroom. The pt required modA of 2 to complete sit-stand transfers and modA with chair follow for short bout of gait in the room. He did not have any overt buckling, but demos poor communication of fatigue and need for seated rest with mobility. The pt currently demos deficits in LE strength, power, stability, and activity tolerance, needs more assistance than wife is able to provide and will benefit from continued inpatient rehab <3hours/day.       If plan is discharge home, recommend the following: A lot of help with walking and/or transfers;A lot of help with bathing/dressing/bathroom;Assistance with cooking/housework;Direct supervision/assist for medications management;Assist for transportation;Direct supervision/assist for financial management;Help with stairs or ramp for entrance;Supervision due to cognitive status   Can travel by private vehicle   No    Equipment Recommendations Wheelchair (measurements PT);Wheelchair cushion (measurements PT);Hoyer lift  Recommendations for [employer]       Functional Status Assessment Patient has had a recent decline in their functional status and demonstrates the ability to make significant improvements in function in a reasonable and predictable amount of time.     Precautions / Restrictions Precautions Precautions: Fall;Other (comment) (delirium) Recall  of Precautions/Restrictions: Impaired Precaution/Restrictions Comments: cues for safety with transfers and RW use. Restrictions Weight Bearing Restrictions Per Provider Order: No      Mobility  Bed Mobility Overal bed mobility: Needs Assistance Bed Mobility: Supine to Sit, Sit to Supine     Supine to sit: Mod assist Sit to supine: Mod assist   General bed mobility comments: cues for sequencing throughout with assist for trunk and scooting out toward EOB, as well as bringing BLE back into bed.    Transfers Overall transfer level: Needs assistance Equipment used: Rolling walker (2 wheels) Transfers: Sit to/from Stand, Bed to chair/wheelchair/BSC Sit to Stand: Min assist, Mod assist, +2 physical assistance, +2 safety/equipment Stand pivot transfers: Min assist, +2 safety/equipment         General transfer comment: min A initially to rise, but up to mod A for balance and power up esp with transition of BUE up to RW from bed. direct cues for hand placement    Ambulation/Gait Ambulation/Gait assistance: +2 safety/equipment, Mod assist Gait Distance (Feet): 18 Feet Assistive device: Rolling walker (2 wheels) Gait Pattern/deviations: Step-through pattern, Decreased stride length, Shuffle, Trunk flexed Gait velocity: decreased Gait velocity interpretation: <1.31 ft/sec, indicative of household ambulator   General Gait Details: small steps, cues to maintain feet inside RW and proximity to RW. no overt buckling. pt with UE shakiness but never stated fatigued, chair follow for safety     Balance Overall balance assessment: Needs assistance Sitting-balance support: No upper extremity supported, Feet supported Sitting balance-Leahy Scale: Fair Sitting balance - Comments: unable to accept any challenge   Standing balance support: During functional activity, Bilateral upper extremity supported Standing balance-Leahy Scale: Poor Standing balance comment: reliant on device and  external support  Pertinent Vitals/Pain Pain Assessment Pain Assessment: No/denies pain    Home Living Family/patient expects to be discharged to:: Private residence Living Arrangements: Spouse/significant other Available Help at Discharge: Family Type of Home: House Home Access: Stairs to enter Entrance Stairs-Rails: None Entrance Stairs-Number of Steps: 1   Home Layout: One level Home Equipment: Shower seat;Grab bars - tub/shower;Hand held Programmer, systems (2 wheels);Cane - single point Additional Comments: aide 3 days/week for 2 hours    Prior Function Prior Level of Function : Needs assist             Mobility Comments: external support and RW, limited mobility in the home ADLs Comments: per wife, pt needs assist with dressing and bathing. PCA 2 hours 3 days/week for A with BADL, bathing     Extremity/Trunk Assessment   Upper Extremity Assessment Upper Extremity Assessment: Defer to OT evaluation    Lower Extremity Assessment Lower Extremity Assessment: Generalized weakness    Cervical / Trunk Assessment Cervical / Trunk Assessment: Kyphotic  Communication   Communication Communication: Impaired Factors Affecting Communication: Hearing impaired (increased time for word finding)    Cognition Arousal: Alert Behavior During Therapy: Flat affect, WFL for tasks assessed/performed   PT - Cognitive impairments: History of cognitive impairments                       PT - Cognition Comments: pt with hx of dementia, family present and reports cog at baseline Following commands: Impaired Following commands impaired: Only follows one step commands consistently, Follows one step commands with increased time     Cueing Cueing Techniques: Verbal cues, Gestural cues, Tactile cues     General Comments General comments (skin integrity, edema, etc.): VSS during session on RA        Assessment/Plan    PT  Assessment Patient needs continued PT services  PT Problem List Decreased strength;Decreased activity tolerance;Decreased balance;Decreased mobility;Decreased cognition;Decreased safety awareness       PT Treatment Interventions DME instruction;Gait training;Stair training;Functional mobility training;Therapeutic activities;Therapeutic exercise;Balance training;Patient/family education    PT Goals (Current goals can be found in the Care Plan section)  Acute Rehab PT Goals Patient Stated Goal: get stronger before going home PT Goal Formulation: With patient/family Time For Goal Achievement: 11/24/23 Potential to Achieve Goals: Good    Frequency Min 2X/week     Co-evaluation PT/OT/SLP Co-Evaluation/Treatment: Yes             AM-PAC PT "6 Clicks" Mobility  Outcome Measure Help needed turning from your back to your side while in a flat bed without using bedrails?: A Lot Help needed moving from lying on your back to sitting on the side of a flat bed without using bedrails?: A Lot Help needed moving to and from a bed to a chair (including a wheelchair)?: A Lot Help needed standing up from a chair using your arms (e.g., wheelchair or bedside chair)?: A Lot Help needed to walk in hospital room?: A Lot Help needed climbing 3-5 steps with a railing? : Total 6 Click Score: 11    End of Session Equipment Utilized During Treatment: Gait belt Activity Tolerance: Patient tolerated treatment well Patient left: in bed;with call bell/phone within reach;with bed alarm set;with family/visitor present Nurse Communication: Mobility status PT Visit Diagnosis: Unsteadiness on feet (R26.81);Muscle weakness (generalized) (M62.81)    Time: 5409-8119 PT Time Calculation (min) (ACUTE ONLY): 37 min   Charges:   PT Evaluation $PT Eval Moderate Complexity: 1 Mod  PT General Charges $$ ACUTE PT VISIT: 1 Visit         Barnabas Booth, PT, DPT   Acute Rehabilitation Department Office  [phone] Secure Chat Communication Preferred  Lona Rist 11/10/2023, 4:19 PM

## 2023-11-11 DIAGNOSIS — E859 Amyloidosis, unspecified: Secondary | ICD-10-CM | POA: Diagnosis not present

## 2023-11-11 DIAGNOSIS — I619 Nontraumatic intracerebral hemorrhage, unspecified: Secondary | ICD-10-CM | POA: Diagnosis not present

## 2023-11-11 DIAGNOSIS — I680 Cerebral amyloid angiopathy: Secondary | ICD-10-CM | POA: Diagnosis not present

## 2023-11-11 DIAGNOSIS — R29703 NIHSS score 3: Secondary | ICD-10-CM | POA: Diagnosis not present

## 2023-11-11 MED ORDER — ENOXAPARIN SODIUM 40 MG/0.4ML IJ SOSY
40.0000 mg | PREFILLED_SYRINGE | INTRAMUSCULAR | Status: DC
  Administered 2023-11-11 – 2023-11-12 (×2): 40 mg via SUBCUTANEOUS
  Filled 2023-11-11 (×2): qty 0.4

## 2023-11-11 MED ORDER — HYDRALAZINE HCL 20 MG/ML IJ SOLN: 20.0000 mg | INTRAMUSCULAR | Status: DC | PRN

## 2023-11-11 MED ORDER — LABETALOL HCL 5 MG/ML IV SOLN: 20.0000 mg | INTRAVENOUS | Status: DC | PRN

## 2023-11-11 NOTE — Progress Notes (Signed)
 Snoring noted when patient is deeply sleep, accompanied with brady cardia and desatting , patient arousal and follows commands. States he doesn't have a cpap at home. Will pass on to day shift

## 2023-11-11 NOTE — TOC Initial Note (Addendum)
 Transition of Care [facility]) - Initial/Assessment Note    Patient Details  Name: Chris Guzman MRN: [id number] Date of Birth: [birth year]  Transition of Care [facility] CM/SW Contact:    Jannice Mends, LCSW Phone Number: 11/11/2023, 9:25 AM  Clinical Narrative:                 9:25am-CSW spoke with patient's wife regarding SNF recommendation. She confirmed they would like SNF for rehab and she has been in touch with [facility] in [place]. CSW will send them referral and left voicemail for admissions.   12:40 PM-[facility]yl[facility] at [facility] reported they can accept patient at discharge.   Expected Discharge Plan: Skilled Nursing Facility Barriers to Discharge: Continued Medical Work up, SNF Pending bed offer   Patient Goals and CMS Choice Patient states their goals for this hospitalization and ongoing recovery are:: Rehab CMS Medicare.gov Compare Post Acute Care list provided to:: Patient Represenative (must comment) Choice offered to / list presented to : Spouse [place] ownership interest in [facility].provided to:: Spouse    Expected Discharge Plan and Services In-house Referral: Clinical Social Work   Post Acute Care Choice: Skilled Nursing Facility Living arrangements for the past 2 months: Single Family Home                                      Prior Living Arrangements/Services Living arrangements for the past 2 months: Single Family Home Lives with:: Spouse Patient language and need for interpreter reviewed:: Yes Do you feel safe going back to the place where you live?: Yes      Need for Family Participation in Patient Care: Yes (Comment) Care giver support system in place?: Yes (comment)   Criminal Activity/Legal Involvement Pertinent to Current Situation/Hospitalization: No - Comment as needed  Activities of Daily Living   ADL Screening (condition at time of admission) Independently performs ADLs?: Yes (appropriate for developmental age) Is the patient  deaf or have difficulty hearing?: No Does the patient have difficulty seeing, even when wearing glasses/contacts?: No Does the patient have difficulty concentrating, remembering, or making decisions?: Yes  Permission Sought/Granted Permission sought to share information with : Facility Medical sales representative, Family Supports Permission granted to share information with : No  Share Information with NAME: Patsy  Permission granted to share info w AGENCY: SNFs  Permission granted to share info w Relationship: Spouse  Permission granted to share info w Contact Information: [phone]  Emotional Assessment Appearance:: Appears stated age Attitude/Demeanor/Rapport: Unable to Assess Affect (typically observed): Unable to Assess Orientation: : Oriented to Self Alcohol / Substance Use: Not Applicable Psych Involvement: No (comment)  Admission diagnosis:  Intraparenchymal hemorrhage of brain (HCC) [I61.9] ICH (intracerebral hemorrhage) (HCC) [I61.9] Patient Active Problem List   Diagnosis Date Noted   Intraparenchymal hemorrhage of brain (HCC) 11/09/2023   ICH (intracerebral hemorrhage) (HCC) 11/09/2023   Liver cyst 07/16/2022   Multiple renal cysts 07/16/2022   Calcification of coronary artery 07/02/2022   Multiple nodules of lung 07/02/2022   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   Seasonal allergic rhinitis 02/14/2022   Alzheimer's disease (HCC) 02/06/2021   Difficulty walking 02/06/2021   Fatigue 02/06/2021   Senile purpura (HCC) 01/06/2020   Diverticulosis of colon 12/07/2018   Venous stasis 12/07/2018   Pulmonary arterial hypertension (HCC) 10/28/2017   Vitamin D deficiency 10/03/2016   Hyperglycemia 09/07/2015   Ischemic heart disease 09/07/2015  Polyp of colon 09/07/2015   OSA (obstructive sleep apnea) 04/11/2014   Aortic valve disorder 03/30/2013   Carotid stenosis 03/30/2013   Carotid bruit 02/13/2012   Snoring 07/18/2009   Hyperlipidemia LDL goal <70  04/05/2009   HYPERTENSION, BENIGN 04/05/2009   CAD, NATIVE VESSEL 04/05/2009   PCP:  Pomposini, Amiel Balder, MD Pharmacy:   [facility] [address], [place] - [address] [place] [address] Phone: [phone] Fax: [phone]     Social Drivers of Health (SDOH) Social History: SDOH Screenings   Food Insecurity: No Food Insecurity (11/10/2023)  Housing: Low Risk  (11/10/2023)  Transportation Needs: No Transportation Needs (11/10/2023)  Utilities: Not At Risk (11/10/2023)  Social Connections: Socially Integrated (11/10/2023)  Tobacco Use: Low Risk  (11/09/2023)   SDOH Interventions:     Readmission Risk Interventions     No data to display

## 2023-11-11 NOTE — NC FL2 (Signed)
 [place]  MEDICAID [id number] LEVEL OF CARE FORM     IDENTIFICATION  Patient Name: Chris Guzman Birthdate: 02/05/1934 Sex: male Admission Date (Current Location): 11/09/2023  [place] and [place] Number:   [place], [place])   Facility and Address:  [facility], [address], [place], [place] [address]      Provider Number: [id number]  Attending Physician Name and Address:  Stroke, Md, MD  Relative Name and Phone Number:       Current Level of Care: Hospital Recommended Level of Care: Skilled Nursing Facility Prior Approval Number:    Date Approved/Denied:   PASRR Number: N/A  Discharge Plan: SNF    Current Diagnoses: Patient Active Problem List   Diagnosis Date Noted   Intraparenchymal hemorrhage of brain (HCC) 11/09/2023   ICH (intracerebral hemorrhage) (HCC) 11/09/2023   Liver cyst 07/16/2022   Multiple renal cysts 07/16/2022   Calcification of coronary artery 07/02/2022   Multiple nodules of lung 07/02/2022   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   Seasonal allergic rhinitis 02/14/2022   Alzheimer's disease (HCC) 02/06/2021   Difficulty walking 02/06/2021   Fatigue 02/06/2021   Senile purpura (HCC) 01/06/2020   Diverticulosis of colon 12/07/2018   Venous stasis 12/07/2018   Pulmonary arterial hypertension (HCC) 10/28/2017   Vitamin D deficiency 10/03/2016   Hyperglycemia 09/07/2015   Ischemic heart disease 09/07/2015   Polyp of colon 09/07/2015   OSA (obstructive sleep apnea) 04/11/2014   Aortic valve disorder 03/30/2013   Carotid stenosis 03/30/2013   Carotid bruit 02/13/2012   Snoring 07/18/2009   Hyperlipidemia LDL goal <70 04/05/2009   HYPERTENSION, BENIGN 04/05/2009   CAD, NATIVE VESSEL 04/05/2009    Orientation RESPIRATION BLADDER Height & Weight     Self  Normal Incontinent, External catheter Weight: 225 lb (102.1 kg) Height:  5\' 11"  (180.3 cm)  BEHAVIORAL SYMPTOMS/MOOD NEUROLOGICAL BOWEL NUTRITION STATUS       Continent Diet (See dc summary)  AMBULATORY STATUS COMMUNICATION OF NEEDS Skin   Extensive Assist Verbally Normal                       Personal Care Assistance Level of Assistance  Bathing, Feeding, Dressing Bathing Assistance: Maximum assistance Feeding assistance: Limited assistance Dressing Assistance: Limited assistance     Functional Limitations Info             SPECIAL CARE FACTORS FREQUENCY  PT (By licensed PT), OT (By licensed OT)     PT Frequency: 5x/week OT Frequency: 5x/week            Contractures Contractures Info: Not present    Additional Factors Info  Code Status, Allergies, Psychotropic Code Status Info: Full Allergies Info: Ace Inhibitors Psychotropic Info: Lexapro          Current Medications (11/11/2023):  This is the current hospital active medication list Current Facility-Administered Medications  Medication Dose Route Frequency Provider Last Rate Last Admin   acetaminophen  (TYLENOL ) tablet 650 mg  650 mg Oral Q4H PRN Augustin Leber, MD   650 mg at 11/10/23 2120   Or   acetaminophen  (TYLENOL ) 160 MG/5ML solution 650 mg  650 mg Per Tube Q4H PRN Augustin Leber, MD       Or   acetaminophen  (TYLENOL ) suppository 650 mg  650 mg Rectal Q4H PRN Augustin Leber, MD       amLODipine  (NORVASC ) tablet 10 mg  10 mg Oral Daily Sethi, Pramod S, MD   10  mg at 11/10/23 1001   budesonide  (PULMICORT ) nebulizer solution 0.5 mg  0.5 mg Nebulization BID Augustin Leber, MD   0.5 mg at 11/11/23 2952   Chlorhexidine  Gluconate Cloth 2 % PADS 6 each  6 each Topical Daily Augustin Leber, MD   6 each at 11/10/23 0913   clevidipine  (CLEVIPREX ) infusion 0.5 mg/mL  0-21 mg/hr Intravenous Continuous Penna, Michael A, DO   Stopped at 11/10/23 8413   donepezil  (ARICEPT ) tablet 10 mg  10 mg Oral QHS Augustin Leber, MD   10 mg at 11/10/23 2121   escitalopram  (LEXAPRO ) tablet 10 mg  10 mg Oral Daily Augustin Leber, MD    10 mg at 11/10/23 2440   hydrALAZINE  (APRESOLINE ) injection 20 mg  20 mg Intravenous Q4H PRN Sethi, Pramod S, MD       labetalol  (NORMODYNE ) injection 20 mg  20 mg Intravenous Q2H PRN Sethi, Pramod S, MD       losartan  (COZAAR ) tablet 12.5 mg  12.5 mg Oral Daily Augustin Leber, MD   12.5 mg at 11/10/23 1027   memantine  (NAMENDA ) tablet 10 mg  10 mg Oral BID Augustin Leber, MD   10 mg at 11/10/23 2124   Oral care mouth rinse  15 mL Mouth Rinse PRN Augustin Leber, MD       pantoprazole  (PROTONIX ) EC tablet 40 mg  40 mg Oral Daily Paliwal, Aditya, MD   40 mg at 11/10/23 2536   senna-docusate (Senokot-S) tablet 1 tablet  1 tablet Oral BID Augustin Leber, MD   1 tablet at 11/10/23 2120   tamsulosin  (FLOMAX ) capsule 0.4 mg  0.4 mg Oral Daily Augustin Leber, MD   0.4 mg at 11/10/23 6440     Discharge Medications: Please see discharge summary for a list of discharge medications.  Relevant Imaging Results:  Relevant Lab Results:   Additional Information SSn: [id number] [place], [place]

## 2023-11-11 NOTE — Progress Notes (Addendum)
 STROKE TEAM PROGRESS NOTE    INTERIM HISTORY/SUBJECTIVE CT scan shows a small right temporal parenchymal hemorrhage. MRI stable.   Family at bedside.   Neurological exam consistent, patient is a little more sleepy today but is easily aroused  Blood pressure adequately controlled, will transfer out of ICU.   OBJECTIVE  CBC    Component Value Date/Time   WBC 9.4 11/10/2023 0956   RBC 4.70 11/10/2023 0956   HGB 15.5 11/10/2023 0956   HCT 46.0 11/10/2023 0956   PLT 149 (L) 11/10/2023 0956   MCV 97.9 11/10/2023 0956   MCH 33.0 11/10/2023 0956   MCHC 33.7 11/10/2023 0956   RDW 12.9 11/10/2023 0956   LYMPHSABS 1.2 05/01/2009 0210   MONOABS 0.7 05/01/2009 0210   EOSABS 0.1 05/01/2009 0210   BASOSABS 0.0 05/01/2009 0210    BMET    Component Value Date/Time   NA 136 11/10/2023 0956   K 4.0 11/10/2023 0956   CL 101 11/10/2023 0956   CO2 25 11/10/2023 0956   GLUCOSE 114 (H) 11/10/2023 0956   BUN 16 11/10/2023 0956   CREATININE 1.30 (H) 11/10/2023 0956   CALCIUM  9.3 11/10/2023 0956   CALCIUM  10.2 08/19/2023 0000   GFRNONAA 53 (L) 11/10/2023 0956    IMAGING past 24 hours ECHOCARDIOGRAM COMPLETE Result Date: 11/10/2023    ECHOCARDIOGRAM REPORT   Patient Name:   Berneda Bridges Date of Exam: 11/10/2023 Medical Rec #:  [id number]      Height:       71.0 in Accession #:    [id number]     Weight:       225.0 lb Date of Birth:  1934-04-23      BSA:          2.217 m Patient Age:    89 years       BP:           117/85 mmHg Patient Gender: M              HR:           80 bpm. Exam Location:  Inpatient Procedure: 2D Echo, Color Doppler and Cardiac Doppler (Both Spectral and Color            Flow Doppler were utilized during procedure). Indications:    Stroke  History:        Patient has prior history of Echocardiogram examinations, most                 recent 08/14/2022. Risk Factors:Hypertension.  Sonographer:    Janette Medley Referring Phys: MCNEILL P Alecia Ames  Sonographer Comments:  Technically challenging study due to limited acoustic windows, Technically difficult study due to poor echo windows and suboptimal apical window. IMPRESSIONS  1. Extremely limited; LV function is preserved; focal wall motion abnormality cannot be excluded; aortic valve is calcifed; doppler suboptimal though some degree of AS is likely; cannnot R/O source of embolus with this study; suggest TEE to further assess if clinically indicated.  2. Left ventricular ejection fraction, by estimation, is 60 to 65%. The left ventricle has normal function. The left ventricle has no regional wall motion abnormalities. There is mild left ventricular hypertrophy. Left ventricular diastolic parameters are consistent with Grade I diastolic dysfunction (impaired relaxation).  3. Right ventricular systolic function was not well visualized. The right ventricular size is not well visualized.  4. The mitral valve is normal in structure. No evidence of mitral valve regurgitation. No evidence of mitral stenosis.  5. The aortic valve is calcified. Aortic valve regurgitation not well assessed.  6. There is borderline dilatation of the aortic root, measuring 39 mm.  7. The inferior vena cava is normal in size with greater than 50% respiratory variability, suggesting right atrial pressure of 3 mmHg. FINDINGS  Left Ventricle: Left ventricular ejection fraction, by estimation, is 60 to 65%. The left ventricle has normal function. The left ventricle has no regional wall motion abnormalities. The left ventricular internal cavity size was normal in size. There is  mild left ventricular hypertrophy. Left ventricular diastolic parameters are consistent with Grade I diastolic dysfunction (impaired relaxation). Right Ventricle: The right ventricular size is not well visualized. Right vetricular wall thickness was not well visualized. Right ventricular systolic function was not well visualized. Left Atrium: Left atrial size was normal in size. Right  Atrium: Right atrial size was not well visualized. Pericardium: There is no evidence of pericardial effusion. Mitral Valve: The mitral valve is normal in structure. No evidence of mitral valve regurgitation. No evidence of mitral valve stenosis. Tricuspid Valve: The tricuspid valve is not well visualized. Tricuspid valve regurgitation is not demonstrated. Aortic Valve: The aortic valve is calcified. Aortic valve regurgitation not well assessed. Pulmonic Valve: The pulmonic valve was normal in structure. Pulmonic valve regurgitation is trivial. No evidence of pulmonic stenosis. Aorta: The aortic root is normal in size and structure. There is borderline dilatation of the aortic root, measuring 39 mm. Venous: The inferior vena cava is normal in size with greater than 50% respiratory variability, suggesting right atrial pressure of 3 mmHg. IAS/Shunts: The interatrial septum was not well visualized. Additional Comments: Extremely limited; LV function is preserved; focal wall motion abnormality cannot be excluded; aortic valve is calcifed; doppler suboptimal though some degree of AS is likely; cannnot R/O source of embolus with this study; suggest TEE to further assess if clinically indicated.  LEFT VENTRICLE PLAX 2D LVIDd:         4.00 cm   Diastology LVIDs:         2.60 cm   LV e' medial:    4.13 cm/s LV PW:         1.30 cm   LV E/e' medial:  12.2 LV IVS:        1.50 cm   LV e' lateral:   6.64 cm/s LVOT diam:     2.20 cm   LV E/e' lateral: 7.6 LV SV:         60 LV SV Index:   27 LVOT Area:     3.80 cm  RIGHT VENTRICLE             IVC RV S prime:     18.30 cm/s  IVC diam: 1.30 cm TAPSE (M-mode): 2.4 cm LEFT ATRIUM           Index LA diam:      4.20 cm 1.89 cm/m LA Vol (A4C): 37.0 ml 16.69 ml/m  AORTIC VALVE AV Area (Vmax):    2.67 cm AV Area (Vmean):   2.66 cm AV Area (VTI):     3.04 cm AV Vmax:           111.00 cm/s AV Vmean:          72.700 cm/s AV VTI:            0.196 m AV Peak Grad:      4.9 mmHg AV Mean Grad:       3.0 mmHg LVOT Vmax:  78.10 cm/s LVOT Vmean:        50.800 cm/s LVOT VTI:          0.157 m LVOT/AV VTI ratio: 0.80  AORTA Ao Root diam: 3.90 cm Ao Asc diam:  3.20 cm MITRAL VALVE MV Area (PHT): 2.62 cm    SHUNTS MV Decel Time: 289 msec    Systemic VTI:  0.16 m MV E velocity: 50.30 cm/s  Systemic Diam: 2.20 cm MV A velocity: 78.60 cm/s MV E/A ratio:  0.64 Alexandria Angel MD Electronically signed by Alexandria Angel MD Signature Date/Time: 11/10/2023/4:14:55 PM    Final     Vitals:   11/11/23 0800 11/11/23 0900 11/11/23 1000 11/11/23 1100  BP: 93/64 113/67 (!) 135/90   Pulse: (!) 53 (!) 44 (!) 54   Resp: 16 (!) 26 16   Temp: 98 F (36.7 C)   99.4 F (37.4 C)  TempSrc: Axillary   Axillary  SpO2: 90% 92% 91%   Weight:      Height:         PHYSICAL EXAM General: Drowsy, well-nourished, well-developed patient in no acute distress Psych:  Calm and cooperative CV: Regular rate and rhythm on monitor Respiratory:  Regular, unlabored respirations on room air  NEURO:  Oriented to name, age, place,  Not oriented to month year at baseline No dysarthria. No aphasia. Follows commands. Unable to repeat full sentences  Able to name objects.  Cranial Nerves:  II: PERRL. Visual fields full.  III, IV, VI: EOMI. Eyelids elevate symmetrically.  V: Sensation is intact to light touch and symmetrical to face.  VII: Face is symmetrical resting and smiling VIII: hearing intact to voice. IX, X: Palate elevates symmetrically. Phonation is normal.  ZO:[id number] shrug 5/5. XII: tongue is midline without fasciculations. Motor: 5/5 strength to all muscle groups tested.  Tone: is normal and bulk is normal Sensation- Intact to light touch bilaterally. Extinction absent to light touch to DSS.   Coordination: FTN intact bilaterally, HKS: no ataxia in BLE.No drift.  Gait- deferred  Most Recent NIH: 3    ASSESSMENT/PLAN [place] is a 89 y.o. male with history of hypertension, hyperlipidemia,  dementia who presents with small intraparenchymal hematoma  NIH on Admission: 2.   Intracerebral Hemorrhage:  right temporal  Etiology:-Hypertensive versus amyloid angiopathy in patient with baseline dementia  CT head : Small acute hemorrhage right temporal lobe, measuring 1.5x1.2cm.  CTA head & neck  No LVO Ulcerated atherosclerosis at the right carotid bifurcation with approximately 70% stenosis of the ICA origin Severe bilateral intradural vertebral artery stenosis  Severe bilateral vertebral artery origin stenosis.  MRI   Early subacute intra-axial hemorrhage in the right temporal lobe with estimated blood volume of 1 mL.  Mild regional edema, no significant mass effect or complicating features. 2D Echo: EF 60-65%, Mild LVH, borderline aortic root dilatation LDL 74 HgbA1c 5.0 VTE prophylaxis - SCDs aspirin 81 mg daily prior to admission, continue No antithrombotic secondary to ICH.  Therapy recommendations:  SNF Disposition:  pending facility selection  Hypertension Coronary artery disease S/p DES to LAD 2010 Home meds:  losartan  12.5mg  daily, amlodipine  10mg  daily, lasix 20mg  daily Norvasc  restarted 5/19 Stable BP Goal: now less than 160  Hyperlipidemia Home meds:  Crestor  40mg  LDL 74., goal < 70 Continue statin at discharge  Other Stroke Risk Factors Obesity, Body mass index is 31.38 kg/m., BMI >/= 30 associated with increased stroke risk, recommend weight loss, diet and exercise as appropriate  Obstructive sleep apnea,  not on CPAP at home   Other Active Problems Hx of Dementia Namenda  and Aricept  PTA meds, continued   Hospital day # 2   Pt seen by Neuro NP/APP with MD. Note/plan to be edited by MD as needed.    Audrene Lease, DNP [facility] Please use AMION for contact information & EPIC for messaging.  I have personally obtained history,examined this patient, reviewed notes, independently viewed imaging studies, participated in medical  decision making and plan of care.ROS completed by me personally and pertinent positives fully documented  I have made any additions or clarifications directly to the above note. Agree with note above.  Patient remains confused and disoriented which is not far from his baseline dementia.  Hemodynamically stable.  Blood pressure adequately controlled.  Plan to transfer out of ICU to floor bed.  Family agreeable to transfer to skilled nursing facility for rehab.  Discussed with patient and wife in son-in-law at the bedside and answered questions.  Greater than 50% time during this 35-minute visit was spent in counseling and coordination of care and discussion with patient and family and care team and answering questions  Ardella Beaver, MD Medical Director [facility] Pager: [phone] 11/11/2023 6:39 PM   To contact Stroke Continuity provider, please refer to [email or web address]. After hours, contact General Neurology

## 2023-11-11 NOTE — Plan of Care (Signed)

## 2023-11-12 DIAGNOSIS — R29703 NIHSS score 3: Secondary | ICD-10-CM | POA: Diagnosis not present

## 2023-11-12 DIAGNOSIS — E859 Amyloidosis, unspecified: Secondary | ICD-10-CM | POA: Diagnosis not present

## 2023-11-12 DIAGNOSIS — I619 Nontraumatic intracerebral hemorrhage, unspecified: Secondary | ICD-10-CM

## 2023-11-12 DIAGNOSIS — I680 Cerebral amyloid angiopathy: Secondary | ICD-10-CM | POA: Diagnosis not present

## 2023-11-12 MED ORDER — POLYVINYL ALCOHOL 1.4 % OP SOLN: 1.0000 [drp] | OPHTHALMIC | Status: AC | PRN

## 2023-11-12 MED ORDER — ROSUVASTATIN CALCIUM 20 MG PO TABS
40.0000 mg | ORAL_TABLET | Freq: Every day | ORAL | Status: DC
  Administered 2023-11-12: 40 mg via ORAL
  Filled 2023-11-12: qty 2

## 2023-11-12 MED ORDER — POLYVINYL ALCOHOL 1.4 % OP SOLN: 1.0000 [drp] | OPHTHALMIC | Status: DC | PRN

## 2023-11-12 NOTE — TOC Progression Note (Signed)
 Transition of Care [facility]) - Progression Note    Patient Details  Name: Hayze Gazda MRN: [id number] Date of Birth: February 13, 1934  [facility]) CM/SW Contact  Jannice Mends, LCSW Phone Number: 11/12/2023, 11:50 AM  Clinical Narrative:    11:50 AM-CSW left message and email for Skylar at [facility] to see if they can accept patient today. Patient admitted to inpatient on 5/18 so has met the three inpatient night requirement for SNF coverage.    Expected Discharge Plan: Skilled Nursing Facility Barriers to Discharge: Continued Medical Work up, SNF Pending bed offer  Expected Discharge Plan and Services In-house Referral: Clinical Social Work   Post Acute Care Choice: Skilled Nursing Facility Living arrangements for the past 2 months: Single Family Home                                       Social Determinants of Health (SDOH) Interventions SDOH Screenings   Food Insecurity: No Food Insecurity (11/10/2023)  Housing: Low Risk  (11/10/2023)  Transportation Needs: No Transportation Needs (11/10/2023)  Utilities: Not At Risk (11/10/2023)  Social Connections: Socially Integrated (11/10/2023)  Tobacco Use: Low Risk  (11/09/2023)    Readmission Risk Interventions     No data to display

## 2023-11-12 NOTE — Progress Notes (Addendum)
 STROKE TEAM PROGRESS NOTE    INTERIM HISTORY/SUBJECTIVE  No family at the bedside.  Vitals and labs are stable Awaiting transfer out of the ICU when bed available Neurologically his exam is stable and unchanged  CBC    Component Value Date/Time   WBC 9.4 11/10/2023 0956   RBC 4.70 11/10/2023 0956   HGB 15.5 11/10/2023 0956   HCT 46.0 11/10/2023 0956   PLT 149 (L) 11/10/2023 0956   MCV 97.9 11/10/2023 0956   MCH 33.0 11/10/2023 0956   MCHC 33.7 11/10/2023 0956   RDW 12.9 11/10/2023 0956   LYMPHSABS 1.2 05/01/2009 0210   MONOABS 0.7 05/01/2009 0210   EOSABS 0.1 05/01/2009 0210   BASOSABS 0.0 05/01/2009 0210    BMET    Component Value Date/Time   NA 136 11/10/2023 0956   K 4.0 11/10/2023 0956   CL 101 11/10/2023 0956   CO2 25 11/10/2023 0956   GLUCOSE 114 (H) 11/10/2023 0956   BUN 16 11/10/2023 0956   CREATININE 1.30 (H) 11/10/2023 0956   CALCIUM  9.3 11/10/2023 0956   CALCIUM  10.2 08/19/2023 0000   GFRNONAA 53 (L) 11/10/2023 0956    IMAGING past 24 hours No results found.   Vitals:   11/12/23 0800 11/12/23 0900 11/12/23 1000 11/12/23 1100  BP: (!) 158/127 (!) 168/84 (!) 142/86 120/84  Pulse: 60 62 62 (!) 57  Resp: (!) 24 16 16 17   Temp: 97.9 F (36.6 C)     TempSrc: Oral     SpO2: 94% 96% 94% 92%  Weight:      Height:         PHYSICAL EXAM General: Drowsy, well-nourished, well-developed patient in no acute distress Psych:  Calm and cooperative CV: Regular rate and rhythm on monitor Respiratory:  Regular, unlabored respirations on room air  NEURO:  Oriented to name, age, place,  Not oriented to month year at baseline No dysarthria. No aphasia. Follows commands. Unable to repeat full sentences  Able to name objects.  Cranial Nerves:  II: PERRL. Visual fields full.  III, IV, VI: EOMI. Eyelids elevate symmetrically.  V: Sensation is intact to light touch and symmetrical to face.  VII: Face is symmetrical resting and smiling VIII: hearing  intact to voice. IX, X: Palate elevates symmetrically. Phonation is normal.  PI:[id number] shrug 5/5. XII: tongue is midline without fasciculations. Motor: 5/5 strength to all muscle groups tested.  Tone: is normal and bulk is normal Sensation- Intact to light touch bilaterally. Extinction absent to light touch to DSS.   Coordination: FTN intact bilaterally, HKS: no ataxia in BLE.No drift.  Gait- deferred  Most Recent NIH: 3    ASSESSMENT/PLAN Chris Guzman is a 89 y.o. male with history of hypertension, hyperlipidemia, dementia who presents with small intraparenchymal hematoma  NIH on Admission: 2.   Intracerebral Hemorrhage:  right temporal  Etiology:-Hypertensive versus amyloid angiopathy in patient with baseline dementia  CT head : Small acute hemorrhage right temporal lobe, measuring 1.5x1.2cm.  CTA head & neck  No LVO Ulcerated atherosclerosis at the right carotid bifurcation with approximately 70% stenosis of the ICA origin Severe bilateral intradural vertebral artery stenosis  Severe bilateral vertebral artery origin stenosis.  MRI   Early subacute intra-axial hemorrhage in the right temporal lobe with estimated blood volume of 1 mL.  Mild regional edema, no significant mass effect or complicating features. 2D Echo: EF 60-65%, Mild LVH, borderline aortic root dilatation LDL 74 HgbA1c 5.0 VTE prophylaxis - SCDs aspirin 81 mg daily  prior to admission, continue No antithrombotic secondary to ICH.  Therapy recommendations:  SNF Disposition:  pending facility selection  Hypertension Coronary artery disease S/p DES to LAD 2010 Home meds:  losartan  12.5mg  daily, amlodipine  10mg  daily, lasix 20mg  daily Norvasc  restarted 5/19 Stable BP Goal: now less than 160  Hyperlipidemia Home meds:  Crestor  40mg  LDL 74., goal < 70 Continue statin at discharge  Other Stroke Risk Factors Obesity, Body mass index is 31.38 kg/m., BMI >/= 30 associated with increased stroke risk,  recommend weight loss, diet and exercise as appropriate  Obstructive sleep apnea, not on CPAP at home   Other Active Problems Hx of Dementia Namenda  and Aricept  PTA meds, continued   Hospital day # 3   Pt seen by Neuro NP/APP with MD. Note/plan to be edited by MD as needed.     Jonette Nestle DNP, ACNPC-AG  [facility]  I have personally obtained history,examined this patient, reviewed notes, independently viewed imaging studies, participated in medical decision making and plan of care.ROS completed by me personally and pertinent positives fully documented  I have made any additions or clarifications directly to the above note. Agree with note above.  Patient has now been transferred to medical hospitalist team.  Patient medically stable to transfer to skilled nursing facility when bed available.  Ardella Beaver, MD Medical Director [facility] Pager: [phone] 11/12/2023 4:00 PM   To contact Stroke Continuity provider, please refer to [email or web address]. After hours, contact General Neurology

## 2023-11-12 NOTE — TOC Transition Note (Signed)
 Transition of Care [facility]) - Discharge Note   Patient Details  Name: Chris Guzman MRN: [id number] Date of Birth: March 04, 1934  [facility]) CM/SW Contact:  Jannice Mends, LCSW Phone Number: 11/12/2023, 1:18 PM   Clinical Narrative:    [facility] will DC to: [facility] Anticipated DC date: 11/12/23 Family notified: Wife Transport by: Lyna Sandhoff   Per MD patient ready for DC to [facility]. RN to call report prior to discharge [phone]). RN, patient, patient's family, and facility notified of DC. Discharge Summary and FL2 sent to facility. DC packet on chart. Ambulance transport requested for patient.   CSW will sign off for now as social work intervention is no longer needed. Please consult us  again if new needs arise.     Final next level of care: Skilled Nursing Facility Barriers to Discharge: Barriers Resolved   Patient Goals and CMS Choice Patient states their goals for this hospitalization and ongoing recovery are:: Rehab CMS Medicare.gov Compare Post Acute Care list provided to:: Patient Represenative (must comment) Choice offered to / list presented to : Spouse [place] ownership interest in [facility].provided to:: Spouse    Discharge Placement PASRR number recieved:  (NA)            Patient [facility] bed at: [facility] Patient to be transferred to facility by: [facility] Name of family member notified: Spouse Patient and family notified of of transfer: 11/12/23  Discharge Plan and Services Additional resources added to the After Visit Summary for   In-house Referral: Clinical Social Work   Post Acute Care Choice: Skilled Nursing Facility                               Social Drivers of Health (SDOH) Interventions SDOH Screenings   Food Insecurity: No Food Insecurity (11/10/2023)  Housing: Low Risk  (11/10/2023)  Transportation Needs: No Transportation Needs (11/10/2023)  Utilities: Not At Risk (11/10/2023)   Social Connections: Socially Integrated (11/10/2023)  Tobacco Use: Low Risk  (11/09/2023)     Readmission Risk Interventions     No data to display

## 2023-11-12 NOTE — Progress Notes (Signed)
 1300: Report called to [facility] and [facility] called by Stephany Ehrich. Patient awaiting transport and given discharge paperwork and instruction.  Wife and Daughter updated at bedside.

## 2023-11-12 NOTE — Progress Notes (Signed)
 PROGRESS NOTE    Chris Guzman  OZH:[id number] DOB: [birth year] DOA: 11/09/2023 PCP: Rayleen Cal, MD    Brief Narrative:   Chris Guzman is a 89 y.o. male with past medical history significant for HTN, HLD, dementia, anxiety/depression, bilateral carotid artery stenosis, CAD s/p DES to LAD (2010), chronic diastolic congestive heart failure, chronic bronchitis who presented to [facility] ED on 11/09/2023 with complaints of dizziness over the last 2 days in which was associate with nausea/vomiting and syncopal episode day at arrival.  Patient also has been complaining of a headache for roughly 2 weeks.  CT head performed demonstrating intraparenchymal hemorrhage.  Neurology was consulted and patient was transferred to the neurosurgical ICU under the care of the neurology team at [facility].  Assessment & Plan:   Right temporal intracerebral hemorrhage Etiology likely secondary to hypertensive versus amyloid angiopathy in the setting of dementia. CT head with small acute hemorrhage right temporal lobe, measuring 1.5x1.2cm. CTA head & neck with no LVO, but noted ulcerated atherosclerosis at the right carotid bifurcation with approximately 70% stenosis of the ICA origin, severe bilateral intradural vertebral artery stenosis, severe bilateral vertebral artery origin stenosis.  MRI Brain with early subacute intra-axial hemorrhage in the right temporal lobe with estimated blood volume of 1 mL, mild regional edema, no significant mass effect or complicating features. 2D Echo with LVEF 60-65%, Mild LVH, borderline aortic root dilatation. LDL 74, HgbA1c 5.0. -- Discontinued home aspirin due to ICH -- Continue Crestor  40 mg p.o. daily. -- Continue PT/OT efforts, pending placement  HTN -- Amlodipine  10 mg p.o. daily -- Losartan  12.5 mL p.o. daily -- Hydralazine  20 mg IV q4h PRN/Labetalol  20 mg IV every 2 hours PRN for SBP >160  HLD -- Crestor  40 mg p.o. daily  Chronic diastolic  congestive heart failure, compensated TTE with LVEF 60 to 65%, LV with no regional wall motion normalities, grade 1 diastolic dysfunction, borderline dilation aortic root measuring 39 mm, IVC normal in size.  On furosemide 20 mg p.o. daily as needed outpatient. -- Strict I's and O's, daily weights  Anxiety/depression -- Lexapro  10 mg p.o. daily  Bilateral carotid artery stenosis Follows with vascular surgery outpatient.  CTA head/neck with no large vessel occlusion, noted ulcerated atherosclerosis at the right carotid bifurcation with approximately 70% stenosis of the ICA origin, severe bilateral intradural vertebral artery stenosis, severe bilateral vertebral artery origin stenosis.  -- Crestor  40 mg p.o. daily -- Not on antithrombotic secondary to ICH as above -- Outpatient follow-up with vascular surgery  CAD  S/p DES to LAD 2010.  BPH -- Tamsulosin  0.4 mg p.o. daily  Chronic bronchitis Follows with pulmonology, Dr. Diania Fortes outpatient -- Budesonide  nebs twice daily  Dementia -- Delirium precautions -- Get up during the day -- Encourage a familiar face to remain present throughout the day -- Keep blinds open and lights on during daylight hours -- Minimize the use of opioids/benzodiazepines -- Donepezil  10 mg p.o. nightly -- Namenda  10 mg p.o. twice daily  Obesity, class II Body mass index is 31.38 kg/m.    DVT prophylaxis: enoxaparin (LOVENOX) injection 40 mg Start: 11/11/23 1015    Code Status: Full Code Family Communication: Updated spouse and daughter present at bedside this morning  Disposition Plan:  Level of care: Telemetry Medical Status is: Inpatient Remains inpatient appropriate because: Pending SNF placement    Consultants:  Neurology: Signed off 5/21  Procedures:  TTE  Antimicrobials:  None   Subjective: Patient seen examined bedside, lying in  bed.  Spouse and daughter present at bedside.  No specific complaints this morning.  Awaiting SNF  placement.  Currently denies headache, no chest pain, no shortness of breath, no abdominal pain, no fever.  No acute events overnight per nursing staff.  Objective: Vitals:   11/12/23 0600 11/12/23 0700 11/12/23 0716 11/12/23 0800  BP: 138/84 (!) 149/85  (!) 158/127  Pulse: (!) 51 (!) 55 (!) 52 60  Resp: 18 19 18  (!) 24  Temp:    97.9 F (36.6 C)  TempSrc:    Oral  SpO2: 93% 93% 93% 94%  Weight:      Height:        Intake/Output Summary (Last 24 hours) at 11/12/2023 1009 Last data filed at 11/12/2023 0500 Gross per 24 hour  Intake --  Output 950 ml  Net -950 ml   Filed Weights   11/09/23 1800  Weight: 102.1 kg    Examination:  Physical Exam: GEN: NAD, alert, pleasantly confused, chronically ill/elderly in appearance HEENT: NCAT, PERRL, EOMI, sclera clear, MMM PULM: CTAB w/o wheezes/crackles, normal respiratory effort, on room air with SpO2 98% at rest CV: RRR w/o M/G/R GI: abd soft, NTND, + BS MSK: no peripheral edema, moves all extremities independently PSYCH: normal mood/affect Integumentary: No concerning rashes/lesions/wounds noted on exposed skin surfaces  Neuro: Oriented to name, age, place.  No dysarthria/aphasia, follows commands.  Cranial nerves II through XII grossly intact, sensation intact to light touch bilaterally.  Gait not assessed.  Data Reviewed: I have personally reviewed following labs and imaging studies  CBC: Recent Labs  Lab 11/09/23 1811 11/10/23 0956  WBC 9.6 9.4  HGB 15.8 15.5  HCT 47.1 46.0  MCV 98.1 97.9  PLT 159 149*   Basic Metabolic Panel: Recent Labs  Lab 11/09/23 1811 11/10/23 0956  NA 138 136  K 4.3 4.0  CL 100 101  CO2 24 25  GLUCOSE 121* 114*  BUN 19 16  CREATININE 1.57* 1.30*  CALCIUM  10.5* 9.3  MG 2.3  --    GFR: Estimated Creatinine Clearance: 48.7 mL/min (A) (by C-G formula based on SCr of 1.3 mg/dL (H)). Liver Function Tests: Recent Labs  Lab 11/09/23 1811  AST 25  ALT 17  ALKPHOS 78  BILITOT 0.8   PROT 7.8  ALBUMIN 4.4   No results for input(s): "LIPASE", "AMYLASE" in the last 168 hours. No results for input(s): "AMMONIA" in the last 168 hours. Coagulation Profile: No results for input(s): "INR", "PROTIME" in the last 168 hours. Cardiac Enzymes: No results for input(s): "CKTOTAL", "CKMB", "CKMBINDEX", "TROPONINI" in the last 168 hours. BNP (last 3 results) Recent Labs    11/09/23 1811  PROBNP 1,162.0*   HbA1C: Recent Labs    11/10/23 0956  HGBA1C 5.0   CBG: No results for input(s): "GLUCAP" in the last 168 hours. Lipid Profile: Recent Labs    11/10/23 0956  CHOL 149  HDL 68  LDLCALC 74  TRIG 33  CHOLHDL 2.2   Thyroid  Function Tests: No results for input(s): "TSH", "T4TOTAL", "FREET4", "T3FREE", "THYROIDAB" in the last 72 hours. Anemia Panel: No results for input(s): "VITAMINB12", "FOLATE", "FERRITIN", "TIBC", "IRON", "RETICCTPCT" in the last 72 hours. Sepsis Labs: No results for input(s): "PROCALCITON", "LATICACIDVEN" in the last 168 hours.  Recent Results (from the past 240 hours)  MRSA Next Gen by PCR, Nasal     Status: None   Collection Time: 11/10/23  5:40 AM   Specimen: Nasal Mucosa; Nasal Swab  Result Value Ref Range  Status   MRSA by PCR Next Gen NOT DETECTED NOT DETECTED Final    Comment: (NOTE) The GeneXpert MRSA Assay (FDA approved for NASAL specimens only), is one component of a comprehensive MRSA colonization surveillance program. It is not intended to diagnose MRSA infection nor to guide or monitor treatment for MRSA infections. Test performance is not FDA approved in patients less than 56 years old. Performed at [facility], [address] [place], [place] [address]          Radiology Studies: ECHOCARDIOGRAM COMPLETE Result Date: 11/10/2023    ECHOCARDIOGRAM REPORT   Patient Name:   Berneda Bridges Date of Exam: 11/10/2023 Medical Rec #:  [id number]      Height:       71.0 in Accession #:    [id number]     Weight:       225.0  lb Date of Birth:  [birth year]      BSA:          2.217 m Patient Age:    89 years       BP:           117/85 mmHg Patient Gender: M              HR:           80 bpm. Exam Location:  Inpatient Procedure: 2D Echo, Color Doppler and Cardiac Doppler (Both Spectral and Color            Flow Doppler were utilized during procedure). Indications:    Stroke  History:        Patient has prior history of Echocardiogram examinations, most                 recent 08/14/2022. Risk Factors:Hypertension.  Sonographer:    Janette Medley Referring Phys: MCNEILL P Alecia Ames  Sonographer Comments: Technically challenging study due to limited acoustic windows, Technically difficult study due to poor echo windows and suboptimal apical window. IMPRESSIONS  1. Extremely limited; LV function is preserved; focal wall motion abnormality cannot be excluded; aortic valve is calcifed; doppler suboptimal though some degree of AS is likely; cannnot R/O source of embolus with this study; suggest TEE to further assess if clinically indicated.  2. Left ventricular ejection fraction, by estimation, is 60 to 65%. The left ventricle has normal function. The left ventricle has no regional wall motion abnormalities. There is mild left ventricular hypertrophy. Left ventricular diastolic parameters are consistent with Grade I diastolic dysfunction (impaired relaxation).  3. Right ventricular systolic function was not well visualized. The right ventricular size is not well visualized.  4. The mitral valve is normal in structure. No evidence of mitral valve regurgitation. No evidence of mitral stenosis.  5. The aortic valve is calcified. Aortic valve regurgitation not well assessed.  6. There is borderline dilatation of the aortic root, measuring 39 mm.  7. The inferior vena cava is normal in size with greater than 50% respiratory variability, suggesting right atrial pressure of 3 mmHg. FINDINGS  Left Ventricle: Left ventricular ejection fraction, by  estimation, is 60 to 65%. The left ventricle has normal function. The left ventricle has no regional wall motion abnormalities. The left ventricular internal cavity size was normal in size. There is  mild left ventricular hypertrophy. Left ventricular diastolic parameters are consistent with Grade I diastolic dysfunction (impaired relaxation). Right Ventricle: The right ventricular size is not well visualized. Right vetricular wall thickness was not well visualized. Right ventricular systolic function  was not well visualized. Left Atrium: Left atrial size was normal in size. Right Atrium: Right atrial size was not well visualized. Pericardium: There is no evidence of pericardial effusion. Mitral Valve: The mitral valve is normal in structure. No evidence of mitral valve regurgitation. No evidence of mitral valve stenosis. Tricuspid Valve: The tricuspid valve is not well visualized. Tricuspid valve regurgitation is not demonstrated. Aortic Valve: The aortic valve is calcified. Aortic valve regurgitation not well assessed. Pulmonic Valve: The pulmonic valve was normal in structure. Pulmonic valve regurgitation is trivial. No evidence of pulmonic stenosis. Aorta: The aortic root is normal in size and structure. There is borderline dilatation of the aortic root, measuring 39 mm. Venous: The inferior vena cava is normal in size with greater than 50% respiratory variability, suggesting right atrial pressure of 3 mmHg. IAS/Shunts: The interatrial septum was not well visualized. Additional Comments: Extremely limited; LV function is preserved; focal wall motion abnormality cannot be excluded; aortic valve is calcifed; doppler suboptimal though some degree of AS is likely; cannnot R/O source of embolus with this study; suggest TEE to further assess if clinically indicated.  LEFT VENTRICLE PLAX 2D LVIDd:         4.00 cm   Diastology LVIDs:         2.60 cm   LV e' medial:    4.13 cm/s LV PW:         1.30 cm   LV E/e' medial:   12.2 LV IVS:        1.50 cm   LV e' lateral:   6.64 cm/s LVOT diam:     2.20 cm   LV E/e' lateral: 7.6 LV SV:         60 LV SV Index:   27 LVOT Area:     3.80 cm  RIGHT VENTRICLE             IVC RV S prime:     18.30 cm/s  IVC diam: 1.30 cm TAPSE (M-mode): 2.4 cm LEFT ATRIUM           Index LA diam:      4.20 cm 1.89 cm/m LA Vol (A4C): 37.0 ml 16.69 ml/m  AORTIC VALVE AV Area (Vmax):    2.67 cm AV Area (Vmean):   2.66 cm AV Area (VTI):     3.04 cm AV Vmax:           111.00 cm/s AV Vmean:          72.700 cm/s AV VTI:            0.196 m AV Peak Grad:      4.9 mmHg AV Mean Grad:      3.0 mmHg LVOT Vmax:         78.10 cm/s LVOT Vmean:        50.800 cm/s LVOT VTI:          0.157 m LVOT/AV VTI ratio: 0.80  AORTA Ao Root diam: 3.90 cm Ao Asc diam:  3.20 cm MITRAL VALVE MV Area (PHT): 2.62 cm    SHUNTS MV Decel Time: 289 msec    Systemic VTI:  0.16 m MV E velocity: 50.30 cm/s  Systemic Diam: 2.20 cm MV A velocity: 78.60 cm/s MV E/A ratio:  0.64 Alexandria Angel MD Electronically signed by Alexandria Angel MD Signature Date/Time: 11/10/2023/4:14:55 PM    Final         Scheduled Meds:  amLODipine   10 mg Oral Daily   budesonide   0.5 mg Nebulization BID   Chlorhexidine  Gluconate Cloth  6 each Topical Daily   donepezil   10 mg Oral QHS   enoxaparin (LOVENOX) injection  40 mg Subcutaneous Q24H   escitalopram   10 mg Oral Daily   losartan   12.5 mg Oral Daily   memantine   10 mg Oral BID   senna-docusate  1 tablet Oral BID   tamsulosin   0.4 mg Oral Daily   Continuous Infusions:   LOS: 3 days    Time spent: 52 minutes spent on 11/12/2023 caring for this patient face-to-face including chart review, ordering labs/tests, documenting, discussion with nursing staff, consultants, updating family and interview/physical exam    Rema Care [place], DO [facility] Available via Epic secure chat 7am-7pm After these hours, please refer to coverage provider listed on [email or web address] 11/12/2023, 10:09 AM

## 2023-11-12 NOTE — Discharge Summary (Signed)
 Physician Discharge Summary  Chris Guzman WUJ:[id number] DOB: [birth year] DOA: 11/09/2023  PCP: Chris Cal, MD  Admit date: 11/09/2023 Discharge date: 11/12/2023  Admitted From: Home Disposition: [name]  Recommendations for Outpatient Follow-up:  Follow up with PCP in 1-2 weeks Discontinued aspirin due to intracerebral hemorrhage  Discharge Condition: Able CODE STATUS: Full code Diet recommendation: Heart healthy diet  History of present illness:  Chris Guzman is a 89 y.o. male with past medical history significant for HTN, HLD, dementia, anxiety/depression, bilateral carotid artery stenosis, CAD s/p DES to LAD (2010), chronic diastolic congestive heart failure, chronic bronchitis who presented to [facility] ED on 11/09/2023 with complaints of dizziness over the last 2 days in which was associate with nausea/vomiting and syncopal episode day at arrival.  Patient also has been complaining of a headache for roughly 2 weeks.  CT head performed demonstrating intraparenchymal hemorrhage.  Neurology was consulted and patient was transferred to the neurosurgical ICU under the care of the neurology team at [facility].   Hospital course:  Right temporal intracerebral hemorrhage Etiology likely secondary to hypertensive versus amyloid angiopathy in the setting of dementia. CT head with small acute hemorrhage right temporal lobe, measuring 1.5x1.2cm. CTA head & neck with no LVO, but noted ulcerated atherosclerosis at the right carotid bifurcation with approximately 70% stenosis of the ICA origin, severe bilateral intradural vertebral artery stenosis, severe bilateral vertebral artery origin stenosis.  MRI Brain with early subacute intra-axial hemorrhage in the right temporal lobe with estimated blood volume of 1 mL, mild regional edema, no significant mass effect or complicating features. 2D Echo with LVEF 60-65%, Mild LVH, borderline aortic root dilatation. LDL 74, HgbA1c  5.0. Discontinued home aspirin due to ICH. Continue Crestor  40 mg p.o. daily.  Seen by PT and OT with recommendation of SNF placement.   HTN Amlodipine  10 mg p.o. daily, Losartan  12.5 mL p.o. daily   HLD Crestor  40 mg p.o. daily   Chronic diastolic congestive heart failure, compensated TTE with LVEF 60 to 65%, LV with no regional wall motion normalities, grade 1 diastolic dysfunction, borderline dilation aortic root measuring 39 mm, IVC normal in size.  On furosemide 20 mg p.o. daily as needed outpatient.   Anxiety/depression Lexapro  10 mg p.o. daily   Bilateral carotid artery stenosis Follows with vascular surgery outpatient.  CTA head/neck with no large vessel occlusion, noted ulcerated atherosclerosis at the right carotid bifurcation with approximately 70% stenosis of the ICA origin, severe bilateral intradural vertebral artery stenosis, severe bilateral vertebral artery origin stenosis. Crestor  40 mg p.o. daily. Not on antithrombotic/antiplatelet secondary to ICH as above. Outpatient follow-up with vascular surgery   CAD  S/p DES to LAD 2010. Continue statin, aspirin discontinued as above.   BPH Tamsulosin  0.4 mg p.o. daily   Chronic bronchitis Follows with pulmonology, Dr. Diania Fortes outpatient. Budesonide  nebs twice daily   Dementia Donepezil  10 mg p.o. nightly, Namenda  10 mg p.o. twice daily   Obesity, class II Body mass index is 31.38 kg/m.  Discharge Diagnoses:  Principal Problem:   Intraparenchymal hemorrhage of brain [facility]) Active Problems:   ICH (intracerebral hemorrhage) [facility])    Discharge Instructions  Discharge Instructions     Call MD for:  difficulty breathing, headache or visual disturbances   Complete by: As directed    Call MD for:  extreme fatigue   Complete by: As directed    Call MD for:  persistant dizziness or light-headedness   Complete by: As directed    Call  MD for:  persistant nausea and vomiting   Complete by: As directed    Call MD for:   severe uncontrolled pain   Complete by: As directed    Call MD for:  temperature >100.4   Complete by: As directed    Diet - low sodium heart healthy   Complete by: As directed    Increase activity slowly   Complete by: As directed       Allergies as of 11/12/2023       Reactions   Ace Inhibitors Cough        Medication List     STOP taking these medications    aspirin 81 MG tablet   olmesartan 20 MG tablet Commonly known as: BENICAR       TAKE these medications    amLODipine  10 MG tablet Commonly known as: NORVASC  Take 10 mg by mouth daily.   ammonium lactate 12 % cream Commonly known as: AMLACTIN ammonium lactate 12 % topical cream  APPLY TO DRY SKIN ON LEGS ARMS AND BACK ONCE TO TWICE DAILY AFTER SHOWERS (AVOID FACE OPEN CUTS AND GROIN)   budesonide  0.5 MG/2ML nebulizer solution Commonly known as: Pulmicort  Take 2 mLs (0.5 mg total) by nebulization in the morning and at bedtime.   CENTRUM SILVER PO Take 1 capsule by mouth daily.   donepezil  10 MG tablet Commonly known as: ARICEPT  Take 10 mg by mouth daily.   escitalopram  10 MG tablet Commonly known as: LEXAPRO  Take 10 mg by mouth daily.   fish oil-omega-3 fatty acids 1000 MG capsule Take 1 g by mouth 2 (two) times daily.   fluticasone 50 MCG/ACT nasal spray Commonly known as: FLONASE Place 2 sprays into both nostrils daily as needed for allergies or rhinitis.   folic acid 800 MCG tablet Commonly known as: FOLVITE Take 800 mcg by mouth daily.   furosemide 20 MG tablet Commonly known as: LASIX Take 20 mg by mouth daily as needed.   losartan  25 MG tablet Commonly known as: COZAAR  Take 0.5 tablets (12.5 mg total) by mouth daily.   memantine  10 MG tablet Commonly known as: NAMENDA  Take 10 mg by mouth 2 (two) times daily.   polyvinyl alcohol 1.4 % ophthalmic solution Commonly known as: LIQUIFILM TEARS Place 1 drop into both eyes as needed for dry eyes.   pyridOXINE 100 MG  tablet Commonly known as: VITAMIN B6 Take 50 mg by mouth daily.   rosuvastatin  40 MG tablet Commonly known as: CRESTOR  Take 1 tablet by mouth once daily   tamsulosin  0.4 MG Caps capsule Commonly known as: FLOMAX  Take 0.4 mg by mouth daily.   VITAMIN B 12 PO Take 1 tablet by mouth daily.   Vitamin D 1000 units capsule Take 1,000 Units by mouth daily.        Follow-up Information     Pomposini, Amiel Balder, MD. Schedule an appointment as soon as possible for a visit in 1 week(s).   Specialty: Internal Medicine               Allergies  Allergen Reactions   Ace Inhibitors Cough    Consultations: Neurology   Procedures/Studies: ECHOCARDIOGRAM COMPLETE Result Date: 11/10/2023    ECHOCARDIOGRAM REPORT   Patient Name:   Berneda Bridges Date of Exam: 11/10/2023 Medical Rec #:  [id number]      Height:       71.0 in Accession #:    [id number]     Weight:  225.0 lb Date of Birth:  Feb 10, 1934      BSA:          2.217 m Patient Age:    30 years       BP:           117/85 mmHg Patient Gender: M              HR:           80 bpm. Exam Location:  Inpatient Procedure: 2D Echo, Color Doppler and Cardiac Doppler (Both Spectral and Color            Flow Doppler were utilized during procedure). Indications:    Stroke  History:        Patient has prior history of Echocardiogram examinations, most                 recent 08/14/2022. Risk Factors:Hypertension.  Sonographer:    Janette Medley Referring Phys: MCNEILL P Alecia Ames  Sonographer Comments: Technically challenging study due to limited acoustic windows, Technically difficult study due to poor echo windows and suboptimal apical window. IMPRESSIONS  1. Extremely limited; LV function is preserved; focal wall motion abnormality cannot be excluded; aortic valve is calcifed; doppler suboptimal though some degree of AS is likely; cannnot R/O source of embolus with this study; suggest TEE to further assess if clinically indicated.  2. Left  ventricular ejection fraction, by estimation, is 60 to 65%. The left ventricle has normal function. The left ventricle has no regional wall motion abnormalities. There is mild left ventricular hypertrophy. Left ventricular diastolic parameters are consistent with Grade I diastolic dysfunction (impaired relaxation).  3. Right ventricular systolic function was not well visualized. The right ventricular size is not well visualized.  4. The mitral valve is normal in structure. No evidence of mitral valve regurgitation. No evidence of mitral stenosis.  5. The aortic valve is calcified. Aortic valve regurgitation not well assessed.  6. There is borderline dilatation of the aortic root, measuring 39 mm.  7. The inferior vena cava is normal in size with greater than 50% respiratory variability, suggesting right atrial pressure of 3 mmHg. FINDINGS  Left Ventricle: Left ventricular ejection fraction, by estimation, is 60 to 65%. The left ventricle has normal function. The left ventricle has no regional wall motion abnormalities. The left ventricular internal cavity size was normal in size. There is  mild left ventricular hypertrophy. Left ventricular diastolic parameters are consistent with Grade I diastolic dysfunction (impaired relaxation). Right Ventricle: The right ventricular size is not well visualized. Right vetricular wall thickness was not well visualized. Right ventricular systolic function was not well visualized. Left Atrium: Left atrial size was normal in size. Right Atrium: Right atrial size was not well visualized. Pericardium: There is no evidence of pericardial effusion. Mitral Valve: The mitral valve is normal in structure. No evidence of mitral valve regurgitation. No evidence of mitral valve stenosis. Tricuspid Valve: The tricuspid valve is not well visualized. Tricuspid valve regurgitation is not demonstrated. Aortic Valve: The aortic valve is calcified. Aortic valve regurgitation not well assessed.  Pulmonic Valve: The pulmonic valve was normal in structure. Pulmonic valve regurgitation is trivial. No evidence of pulmonic stenosis. Aorta: The aortic root is normal in size and structure. There is borderline dilatation of the aortic root, measuring 39 mm. Venous: The inferior vena cava is normal in size with greater than 50% respiratory variability, suggesting right atrial pressure of 3 mmHg. IAS/Shunts: The interatrial septum was not well visualized. Additional  Comments: Extremely limited; LV function is preserved; focal wall motion abnormality cannot be excluded; aortic valve is calcifed; doppler suboptimal though some degree of AS is likely; cannnot R/O source of embolus with this study; suggest TEE to further assess if clinically indicated.  LEFT VENTRICLE PLAX 2D LVIDd:         4.00 cm   Diastology LVIDs:         2.60 cm   LV e' medial:    4.13 cm/s LV PW:         1.30 cm   LV E/e' medial:  12.2 LV IVS:        1.50 cm   LV e' lateral:   6.64 cm/s LVOT diam:     2.20 cm   LV E/e' lateral: 7.6 LV SV:         60 LV SV Index:   27 LVOT Area:     3.80 cm  RIGHT VENTRICLE             IVC RV S prime:     18.30 cm/s  IVC diam: 1.30 cm TAPSE (M-mode): 2.4 cm LEFT ATRIUM           Index LA diam:      4.20 cm 1.89 cm/m LA Vol (A4C): 37.0 ml 16.69 ml/m  AORTIC VALVE AV Area (Vmax):    2.67 cm AV Area (Vmean):   2.66 cm AV Area (VTI):     3.04 cm AV Vmax:           111.00 cm/s AV Vmean:          72.700 cm/s AV VTI:            0.196 m AV Peak Grad:      4.9 mmHg AV Mean Grad:      3.0 mmHg LVOT Vmax:         78.10 cm/s LVOT Vmean:        50.800 cm/s LVOT VTI:          0.157 m LVOT/AV VTI ratio: 0.80  AORTA Ao Root diam: 3.90 cm Ao Asc diam:  3.20 cm MITRAL VALVE MV Area (PHT): 2.62 cm    SHUNTS MV Decel Time: 289 msec    Systemic VTI:  0.16 m MV E velocity: 50.30 cm/s  Systemic Diam: 2.20 cm MV A velocity: 78.60 cm/s MV E/A ratio:  0.64 Alexandria Angel MD Electronically signed by Alexandria Angel MD Signature  Date/Time: 11/10/2023/4:14:55 PM    Final    MR BRAIN WO CONTRAST Result Date: 11/10/2023 CLINICAL DATA:  89 year old male with dizziness, CT evidence of right temporal lobe hemorrhage. EXAM: MRI HEAD WITHOUT CONTRAST TECHNIQUE: Multiplanar, multiecho pulse sequences of the brain and surrounding structures were obtained without intravenous contrast. COMPARISON:  CT head, CTA head and neck yesterday. FINDINGS: Brain: DWI and susceptibility artifact in the posterior right inferior frontal gyrus corresponding to intra-axial rounded hemorrhage encompassing about 15 by 12 x 11 mm (AP by transverse by CC) with T2 hypointensity, T1 hyperintensity. Mild regional edema. Minor associated mass effect (series 6, image 12). No other convincing parenchymal acute or chronic blood products. No IVH. Underlying nonspecific cerebral volume loss. Patchy, moderate bilateral cerebral white matter T2 and FLAIR hyperintensity. There is a small area of chronic cortical encephalomalacia in the right superior frontal gyrus on series 6, image 30. No restricted diffusion to suggest acute infarction. No midline shift, mass effect, evidence of mass lesion, ventriculomegaly, extra-axial collection. Cervicomedullary junction and pituitary  are within normal limits. Deep gray nuclei, brainstem, cerebellum signal is normal for age. Vascular: Major intracranial vascular flow voids are preserved. Skull and upper cervical spine: Negative visible cervical spine for age. Visualized bone marrow signal is within normal limits. Sinuses/Orbits: Postoperative changes to the right globe. Paranasal Visualized paranasal sinuses and mastoids are stable and well aerated. Other: Visible internal auditory structures appear normal. IMPRESSION: 1. Early subacute intra-axial hemorrhage in the right temporal lobe with estimated blood volume of 1 mL. Mild regional edema, no significant mass effect or complicating features. 2. No other acute intracranial abnormality. No  other chronic cerebral blood products on SWI. Nonspecific Cerebral Atrophy (ICD10-G31.9). Electronically Signed   By: Marlise Simpers M.D.   On: 11/10/2023 10:14   CT ANGIO HEAD NECK W WO CM Result Date: 11/09/2023 CLINICAL DATA:  +IPH. EXAM: CT ANGIOGRAPHY HEAD AND NECK WITH AND WITHOUT CONTRAST TECHNIQUE: Multidetector CT imaging of the head and neck was performed using the standard protocol during bolus administration of intravenous contrast. Multiplanar CT image reconstructions and MIPs were obtained to evaluate the vascular anatomy. Carotid stenosis measurements (when applicable) are obtained utilizing NASCET criteria, using the distal internal carotid diameter as the denominator. RADIATION DOSE REDUCTION: This exam was performed according to the departmental dose-optimization program which includes automated exposure control, adjustment of the mA and/or kV according to patient size and/or use of iterative reconstruction technique. CONTRAST:  75mL OMNIPAQUE  IOHEXOL  350 MG/ML SOLN COMPARISON:  None Available. FINDINGS: CTA NECK FINDINGS Aortic arch: Great vessel origins are patent without significant stenosis. Aortic atherosclerosis. Right carotid system: Ulcerated atherosclerosis at the carotid bifurcation with approximately 70% stenosis of the ICA origin. Left carotid system: Atherosclerosis at the carotid bifurcation without greater than 50% stenosis. Tortuous ICA at the skull base with mild ectasia. Vertebral arteries: Severe bilateral vertebral artery origin stenosis due to atherosclerosis. Otherwise, vertebral arteries are patent without greater vision stenosis. Skeleton: No acute abnormality on limited assessment. Other neck: No acute abnormality on limited assessment. Upper chest: Visualized lung apices are clear. Review of the MIP images confirms the above findings CTA HEAD FINDINGS Anterior circulation: Bilateral intracranial ICAs are patent with mild for age atherosclerotic narrowing. Bilateral MCAs and  ACAs are patent without proximal hemodynamically significant stenosis. Posterior circulation: Severe bilateral intradural vertebral artery stenosis due to atherosclerosis. The basilar artery and bilateral posterior cerebral arteries are patent without proximal hemodynamically significant stenosis. Venous sinuses: As permitted by contrast timing, patent. Review of the MIP images confirms the above findings IMPRESSION: 1. No emergent large vessel occlusion. 2. Ulcerated atherosclerosis at the right carotid bifurcation with approximately 70% stenosis of the ICA origin. 3. Severe bilateral intradural vertebral artery stenosis. 4. Severe bilateral vertebral artery origin stenosis. 5. Aortic Atherosclerosis (ICD10-I70.0). Electronically Signed   By: Stevenson Elbe M.D.   On: 11/09/2023 20:50   CT Head Wo Contrast Result Date: 11/09/2023 CLINICAL DATA:  Head trauma, minor (Age >= 65y). Dizziness, syncope, vomiting. EXAM: CT HEAD WITHOUT CONTRAST TECHNIQUE: Contiguous axial images were obtained from the base of the skull through the vertex without intravenous contrast. RADIATION DOSE REDUCTION: This exam was performed according to the departmental dose-optimization program which includes automated exposure control, adjustment of the mA and/or kV according to patient size and/or use of iterative reconstruction technique. COMPARISON:  06/21/2022 FINDINGS: Brain: Small area of hemorrhage in the right temporal lobe measuring 1.5 x 1.2 cm. There is atrophy and chronic small vessel disease changes. No acute infarct or hydrocephalus. Vascular: No hyperdense vessel or  unexpected calcification. Skull: No acute calvarial abnormality. Sinuses/Orbits: No acute findings Other: None IMPRESSION: Small area of small rounded hyperdense area in the right temporal lobe measuring 1.5 x 1.2 cm compatible with small acute hemorrhage. Atrophy, chronic small vessel disease. These results were called by telephone at the time of interpretation  on 11/09/2023 at 7:08 pm to provider [facility] , who verbally acknowledged these results. Electronically Signed   By: Janeece Mechanic M.D.   On: 11/09/2023 19:09   DG Chest Portable 1 View Result Date: 11/09/2023 CLINICAL DATA:  Dizziness, syncope, vomiting.  Shortness of breath. EXAM: PORTABLE CHEST 1 VIEW COMPARISON:  05/01/2009 FINDINGS: Heart and mediastinal contours are within normal limits. No focal opacities or effusions. No acute bony abnormality. Aortic atherosclerosis. IMPRESSION: No active disease. Electronically Signed   By: Janeece Mechanic M.D.   On: 11/09/2023 19:04     Subjective: Patient seen examined bedside, lying in bed.  Spouse and daughter present at bedside.  Discharging to SNF today.  Patient denies headache, no chest pain, no shortness of breath, no abdominal pain, no fever.  No acute events overnight per nursing staff.  Discharge Exam: Vitals:   11/12/23 1000 11/12/23 1100  BP: (!) 142/86 120/84  Pulse: 62 (!) 57  Resp: 16 17  Temp:    SpO2: 94% 92%   Vitals:   11/12/23 0800 11/12/23 0900 11/12/23 1000 11/12/23 1100  BP: (!) 158/127 (!) 168/84 (!) 142/86 120/84  Pulse: 60 62 62 (!) 57  Resp: (!) 24 16 16 17   Temp: 97.9 F (36.6 C)     TempSrc: Oral     SpO2: 94% 96% 94% 92%  Weight:      Height:        Physical Exam: GEN: NAD, alert, pleasantly confused, chronically ill/elderly in appearance HEENT: NCAT, PERRL, EOMI, sclera clear, MMM PULM: CTAB w/o wheezes/crackles, normal respiratory effort, on room air with SpO2 98% at rest CV: RRR w/o M/G/R GI: abd soft, NTND, + BS MSK: no peripheral edema, moves all extremities independently PSYCH: normal mood/affect Integumentary: No concerning rashes/lesions/wounds noted on exposed skin surfaces   Neuro: Oriented to name, age, place.  No dysarthria/aphasia, follows commands.  Cranial nerves II through XII grossly intact, sensation intact to light touch bilaterally.  Gait not assessed.    The results of  significant diagnostics from this hospitalization (including imaging, microbiology, ancillary and laboratory) are listed below for reference.     Microbiology: Recent Results (from the past 240 hours)  MRSA Next Gen by PCR, Nasal     Status: None   Collection Time: 11/10/23  5:40 AM   Specimen: Nasal Mucosa; Nasal Swab  Result Value Ref Range Status   MRSA by PCR Next Gen NOT DETECTED NOT DETECTED Final    Comment: (NOTE) The GeneXpert MRSA Assay (FDA approved for NASAL specimens only), is one component of a comprehensive MRSA colonization surveillance program. It is not intended to diagnose MRSA infection nor to guide or monitor treatment for MRSA infections. Test performance is not FDA approved in patients less than 23 years old. Performed at [facility], [address] [place], [place] [address]      Labs: BNP (last 3 results) No results for input(s): "BNP" in the last 8760 hours. Basic Metabolic Panel: Recent Labs  Lab 11/09/23 1811 11/10/23 0956  NA 138 136  K 4.3 4.0  CL 100 101  CO2 24 25  GLUCOSE 121* 114*  BUN 19 16  CREATININE  1.57* 1.30*  CALCIUM  10.5* 9.3  MG 2.3  --    Liver Function Tests: Recent Labs  Lab 11/09/23 1811  AST 25  ALT 17  ALKPHOS 78  BILITOT 0.8  PROT 7.8  ALBUMIN 4.4   No results for input(s): "LIPASE", "AMYLASE" in the last 168 hours. No results for input(s): "AMMONIA" in the last 168 hours. CBC: Recent Labs  Lab 11/09/23 1811 11/10/23 0956  WBC 9.6 9.4  HGB 15.8 15.5  HCT 47.1 46.0  MCV 98.1 97.9  PLT 159 149*   Cardiac Enzymes: No results for input(s): "CKTOTAL", "CKMB", "CKMBINDEX", "TROPONINI" in the last 168 hours. BNP: Invalid input(s): "POCBNP" CBG: No results for input(s): "GLUCAP" in the last 168 hours. D-Dimer No results for input(s): "DDIMER" in the last 72 hours. Hgb A1c Recent Labs    11/10/23 0956  HGBA1C 5.0   Lipid Profile Recent Labs    11/10/23 0956  CHOL 149  HDL 68  LDLCALC 74   TRIG 33  CHOLHDL 2.2   Thyroid  function studies No results for input(s): "TSH", "T4TOTAL", "T3FREE", "THYROIDAB" in the last 72 hours.  Invalid input(s): "FREET3" Anemia work up No results for input(s): "VITAMINB12", "FOLATE", "FERRITIN", "TIBC", "IRON", "RETICCTPCT" in the last 72 hours. Urinalysis    Component Value Date/Time   COLORURINE YELLOW 11/10/2023 0541   APPEARANCEUR CLOUDY (A) 11/10/2023 0541   LABSPEC 1.045 (H) 11/10/2023 0541   PHURINE 6.0 11/10/2023 0541   GLUCOSEU NEGATIVE 11/10/2023 0541   HGBUR LARGE (A) 11/10/2023 0541   BILIRUBINUR NEGATIVE 11/10/2023 0541   KETONESUR 5 (A) 11/10/2023 0541   PROTEINUR 100 (A) 11/10/2023 0541   UROBILINOGEN 1.0 03/20/2009 2351   NITRITE NEGATIVE 11/10/2023 0541   LEUKOCYTESUR NEGATIVE 11/10/2023 0541   Sepsis Labs Recent Labs  Lab 11/09/23 1811 11/10/23 0956  WBC 9.6 9.4   Microbiology Recent Results (from the past 240 hours)  MRSA Next Gen by PCR, Nasal     Status: None   Collection Time: 11/10/23  5:40 AM   Specimen: Nasal Mucosa; Nasal Swab  Result Value Ref Range Status   MRSA by PCR Next Gen NOT DETECTED NOT DETECTED Final    Comment: (NOTE) The GeneXpert MRSA Assay (FDA approved for NASAL specimens only), is one component of a comprehensive MRSA colonization surveillance program. It is not intended to diagnose MRSA infection nor to guide or monitor treatment for MRSA infections. Test performance is not FDA approved in patients less than 41 years old. Performed at [facility], [address] [place], [place] [address]      Time coordinating discharge: Over 30 minutes  SIGNED:   Rema Care [place], DO  [facility] 11/12/2023, 11:56 AM

## 2024-01-07 ENCOUNTER — Ambulatory Visit: Admitting: Pulmonary Disease

## 2024-03-01 ENCOUNTER — Ambulatory Visit

## 2024-03-01 ENCOUNTER — Encounter (HOSPITAL_COMMUNITY)

## 2024-04-12 ENCOUNTER — Encounter (HOSPITAL_COMMUNITY)

## 2024-04-12 ENCOUNTER — Ambulatory Visit
